# Patient Record
Sex: Female | Born: 1945 | Race: White | Hispanic: No | Marital: Married | Smoking: Never smoker
Health system: Southern US, Community
[De-identification: ages and names within clinical notes are randomized; demographics above are authoritative.]

## PROBLEM LIST (undated history)

## (undated) DIAGNOSIS — F419 Anxiety disorder, unspecified: Secondary | ICD-10-CM

## (undated) DIAGNOSIS — E7800 Pure hypercholesterolemia, unspecified: Secondary | ICD-10-CM

## (undated) DIAGNOSIS — G459 Transient cerebral ischemic attack, unspecified: Secondary | ICD-10-CM

## (undated) DIAGNOSIS — E010 Iodine-deficiency related diffuse (endemic) goiter: Secondary | ICD-10-CM

## (undated) DIAGNOSIS — F32A Depression, unspecified: Secondary | ICD-10-CM

## (undated) DIAGNOSIS — E785 Hyperlipidemia, unspecified: Secondary | ICD-10-CM

## (undated) DIAGNOSIS — E119 Type 2 diabetes mellitus without complications: Secondary | ICD-10-CM

## (undated) DIAGNOSIS — R42 Dizziness and giddiness: Secondary | ICD-10-CM

## (undated) DIAGNOSIS — F5101 Primary insomnia: Secondary | ICD-10-CM

## (undated) DIAGNOSIS — I10 Essential (primary) hypertension: Secondary | ICD-10-CM

## (undated) DIAGNOSIS — F329 Major depressive disorder, single episode, unspecified: Secondary | ICD-10-CM

## (undated) DIAGNOSIS — E538 Deficiency of other specified B group vitamins: Secondary | ICD-10-CM

## (undated) DIAGNOSIS — I6523 Occlusion and stenosis of bilateral carotid arteries: Secondary | ICD-10-CM

## (undated) HISTORY — DX: Occlusion and stenosis of bilateral carotid arteries: I65.23

## (undated) HISTORY — DX: Iodine-deficiency related diffuse (endemic) goiter: E01.0

## (undated) HISTORY — DX: Transient cerebral ischemic attack, unspecified: G45.9

## (undated) HISTORY — DX: Essential (primary) hypertension: I10

## (undated) HISTORY — PX: TUBAL LIGATION: SHX77

## (undated) HISTORY — DX: Deficiency of other specified B group vitamins: E53.8

## (undated) HISTORY — DX: Primary insomnia: F51.01

## (undated) HISTORY — DX: Anxiety disorder, unspecified: F41.9

---

## 1999-01-17 ENCOUNTER — Other Ambulatory Visit: Admission: RE | Admit: 1999-01-17 | Discharge: 1999-01-17 | Payer: Self-pay | Admitting: Gynecology

## 1999-01-25 ENCOUNTER — Other Ambulatory Visit: Admission: RE | Admit: 1999-01-25 | Discharge: 1999-01-25 | Payer: Self-pay | Admitting: Gynecology

## 1999-01-25 ENCOUNTER — Encounter (INDEPENDENT_AMBULATORY_CARE_PROVIDER_SITE_OTHER): Payer: Self-pay | Admitting: *Deleted

## 2000-04-06 ENCOUNTER — Other Ambulatory Visit: Admission: RE | Admit: 2000-04-06 | Discharge: 2000-04-06 | Payer: Self-pay | Admitting: Gynecology

## 2001-05-06 ENCOUNTER — Other Ambulatory Visit: Admission: RE | Admit: 2001-05-06 | Discharge: 2001-05-06 | Payer: Self-pay | Admitting: Gynecology

## 2002-03-30 ENCOUNTER — Encounter: Payer: Self-pay | Admitting: Internal Medicine

## 2002-03-30 ENCOUNTER — Encounter (INDEPENDENT_AMBULATORY_CARE_PROVIDER_SITE_OTHER): Payer: Self-pay | Admitting: *Deleted

## 2002-03-30 ENCOUNTER — Encounter: Admission: RE | Admit: 2002-03-30 | Discharge: 2002-03-30 | Payer: Self-pay | Admitting: Internal Medicine

## 2002-11-08 ENCOUNTER — Other Ambulatory Visit: Admission: RE | Admit: 2002-11-08 | Discharge: 2002-11-08 | Payer: Self-pay | Admitting: Gynecology

## 2004-09-23 ENCOUNTER — Other Ambulatory Visit: Admission: RE | Admit: 2004-09-23 | Discharge: 2004-09-23 | Payer: Self-pay | Admitting: Gynecology

## 2007-10-07 ENCOUNTER — Ambulatory Visit (HOSPITAL_BASED_OUTPATIENT_CLINIC_OR_DEPARTMENT_OTHER): Admission: RE | Admit: 2007-10-07 | Discharge: 2007-10-07 | Payer: Self-pay | Admitting: Orthopedic Surgery

## 2010-06-11 NOTE — Op Note (Signed)
Stacy Orozco, Stacy Orozco NO.:  [id number]   MEDICAL RECORD NO.:  [id number]          PATIENT TYPE:  AMB   LOCATION:  DSC                          FACILITY:  [facility]   PHYSICIAN:  Cindee Salt, M.D.       DATE OF BIRTH:  Mar 12, 1945   DATE OF PROCEDURE:  10/07/2007  DATE OF DISCHARGE:                               OPERATIVE REPORT   PREOPERATIVE DIAGNOSIS:  Carpal tunnel syndrome, right hand.   POSTOPERATIVE DIAGNOSIS:  Carpal tunnel syndrome, right hand.   OPERATION:  Decompression, right median nerve.   SURGEON:  Cindee Salt, MD   ASSISTANT:  Joaquin Courts, RN   ANESTHESIA:  Forearm based IV regional.   ANESTHESIOLOGIST:  Guadalupe Maple, MD   HISTORY:  The patient is a 65 year old female with a history of carpal  tunnel syndrome, EMG nerve conductions positive which has not responded  to conservative treatment.  She has elected to proceed to have this  surgically released.  Postoperative course has been discussed along with  risks and complications.  She is aware there is no guarantee with  surgery, possible infection, recurrence, injury to arteries, nerves,  tendons, complete relief of symptoms, and dystrophy.  In the  preoperative area, the patient was seen.  The extremity marked by both  the patient and surgeon.  Antibiotic given.   PROCEDURE:  The patient was brought to the operating room where a  forearm based IV regional anesthetic was carried out without difficulty.  She was prepped using DuraPrep, supine position, right arm free.  A time-  out was taken.  Following this, a longitudinal incision was made in the  palm and carried down through subcutaneous tissue.  Bleeders were  electrocauterized.  The palmar fascia was split.  Superficial palmar  arch identified.  The flexor tendon to the ring and little finger  identified.  To the ulnar side of the median nerve, the carpal  retinaculum was incised with sharp dissection.  A right-angle and Sewall  retractor were placed between skin and forearm fascia.  The fascia was  released for approximately a centimeter and half proximal to the wrist  crease under direct vision.  Canal was explored.  Air compression of the  nerve was apparent.  No further lesions were identified.  The wound was  irrigated.  The skin closed interrupted with interrupted 5-0 Vicryl  Rapide sutures.  Sterile compressive dressings and splint to the wrist  applied.  The patient tolerated the procedure well and was taken to the  recovery room observation in satisfactory condition.  She will be  discharged home to return to the [facility] in 1 week on  Vicodin.          ______________________________  Cindee Salt, M.D.    GK/MEDQ  D:  10/07/2007  T:  10/08/2007  Job:  [id number]

## 2010-10-30 LAB — POCT HEMOGLOBIN-HEMACUE: Hemoglobin: 12.5

## 2010-11-07 ENCOUNTER — Ambulatory Visit (HOSPITAL_COMMUNITY)
Admission: RE | Admit: 2010-11-07 | Discharge: 2010-11-07 | Disposition: A | Discharge status: Home / Self Care | Payer: Medicare Other | Source: Ambulatory Visit | Attending: Gastroenterology | Admitting: Gastroenterology

## 2010-11-07 DIAGNOSIS — Z79899 Other long term (current) drug therapy: Secondary | ICD-10-CM | POA: Insufficient documentation

## 2010-11-07 DIAGNOSIS — E7800 Pure hypercholesterolemia, unspecified: Secondary | ICD-10-CM | POA: Insufficient documentation

## 2010-11-07 DIAGNOSIS — G4700 Insomnia, unspecified: Secondary | ICD-10-CM | POA: Insufficient documentation

## 2010-11-07 DIAGNOSIS — Z860100 Personal history of colon polyps, unspecified: Secondary | ICD-10-CM | POA: Insufficient documentation

## 2010-11-07 DIAGNOSIS — Z1211 Encounter for screening for malignant neoplasm of colon: Secondary | ICD-10-CM | POA: Insufficient documentation

## 2010-11-07 DIAGNOSIS — Z7982 Long term (current) use of aspirin: Secondary | ICD-10-CM | POA: Insufficient documentation

## 2010-11-18 NOTE — Op Note (Signed)
  NAMEMarland Kitchen  EMBERLI, BALLESTER NO.:  [id number]  MEDICAL RECORD NO.:  [id number]  LOCATION:  WLEN                         FACILITY:  [facility]  PHYSICIAN:  Danise Edge, M.D.   DATE OF BIRTH:  1945/03/30  DATE OF PROCEDURE:  11/07/2010 DATE OF DISCHARGE:                              OPERATIVE REPORT   REFERRING PHYSICIAN:  Theressa Millard, MD  PROCEDURE:  Surveillance colonoscopy.  HISTORY:  Ms. Stacy Orozco is a 65 year old female born on 12-10-1945.  In 2009, she underwent a baseline screening colonoscopy with removal of a small adenomatous polyp from the sigmoid colon.  She is scheduled for a surveillance colonoscopy today.  ENDOSCOPIST:  Danise Edge, MD  PREMEDICATION:  Versed 7.5 mg, fentanyl 100 mcg.  PROCEDURE IN DETAIL:  The patient was placed in the left lateral decubitus position.  Anal inspection and digital rectal exam were normal.  The Pentax pediatric colonoscope was introduced into the rectum and advanced to the cecum.  A normal-appearing ileocecal valve and appendiceal orifice were identified.  Colonic preparation for the exam today was good.  Rectum normal.  Retroflex view of the distal rectum normal.  Sigmoid colon and descending colon normal.  Splenic flexure normal.  Transverse colon normal.  Hepatic flexure normal.  Ascending colon normal.  Cecum and ileocecal valve normal.  ASSESSMENT:  Normal surveillance proctocolonoscopy to the cecum.  RECOMMENDATIONS:  Schedule repeat surveillance colonoscopy in 5 years.          ______________________________ Danise Edge, M.D.     MJ/MEDQ  D:  11/07/2010  T:  11/07/2010  Job:  [id number]  cc:   Theressa Millard, M.D. Fax: [phone]  Electronically Signed by Danise Edge M.D. on 11/18/2010 02:05:30 PM

## 2012-12-04 ENCOUNTER — Emergency Department (HOSPITAL_COMMUNITY): Payer: Medicare Other

## 2012-12-04 ENCOUNTER — Encounter (HOSPITAL_COMMUNITY): Payer: Self-pay | Admitting: Emergency Medicine

## 2012-12-04 ENCOUNTER — Emergency Department (HOSPITAL_COMMUNITY)
Admission: EM | Admit: 2012-12-04 | Discharge: 2012-12-04 | Disposition: A | Discharge status: Home / Self Care | Payer: Medicare Other | Attending: Emergency Medicine | Admitting: Emergency Medicine

## 2012-12-04 DIAGNOSIS — Z79899 Other long term (current) drug therapy: Secondary | ICD-10-CM | POA: Insufficient documentation

## 2012-12-04 DIAGNOSIS — F3289 Other specified depressive episodes: Secondary | ICD-10-CM | POA: Insufficient documentation

## 2012-12-04 DIAGNOSIS — E119 Type 2 diabetes mellitus without complications: Secondary | ICD-10-CM | POA: Insufficient documentation

## 2012-12-04 DIAGNOSIS — R0789 Other chest pain: Secondary | ICD-10-CM | POA: Insufficient documentation

## 2012-12-04 DIAGNOSIS — F411 Generalized anxiety disorder: Secondary | ICD-10-CM | POA: Insufficient documentation

## 2012-12-04 DIAGNOSIS — F419 Anxiety disorder, unspecified: Secondary | ICD-10-CM

## 2012-12-04 DIAGNOSIS — R42 Dizziness and giddiness: Secondary | ICD-10-CM | POA: Insufficient documentation

## 2012-12-04 DIAGNOSIS — R002 Palpitations: Secondary | ICD-10-CM | POA: Insufficient documentation

## 2012-12-04 DIAGNOSIS — Z880 Allergy status to penicillin: Secondary | ICD-10-CM | POA: Insufficient documentation

## 2012-12-04 DIAGNOSIS — E785 Hyperlipidemia, unspecified: Secondary | ICD-10-CM | POA: Insufficient documentation

## 2012-12-04 DIAGNOSIS — F329 Major depressive disorder, single episode, unspecified: Secondary | ICD-10-CM | POA: Insufficient documentation

## 2012-12-04 DIAGNOSIS — E7800 Pure hypercholesterolemia, unspecified: Secondary | ICD-10-CM | POA: Insufficient documentation

## 2012-12-04 DIAGNOSIS — M542 Cervicalgia: Secondary | ICD-10-CM | POA: Insufficient documentation

## 2012-12-04 DIAGNOSIS — K5900 Constipation, unspecified: Secondary | ICD-10-CM | POA: Insufficient documentation

## 2012-12-04 HISTORY — DX: Type 2 diabetes mellitus without complications: E11.9

## 2012-12-04 HISTORY — DX: Pure hypercholesterolemia, unspecified: E78.00

## 2012-12-04 LAB — COMPREHENSIVE METABOLIC PANEL
ALT: 17 U/L (ref 0–35)
AST: 16 U/L (ref 0–37)
Alkaline Phosphatase: 70 U/L (ref 39–117)
BUN: 15 mg/dL (ref 6–23)
CO2: 24 mEq/L (ref 19–32)
Calcium: 10 mg/dL (ref 8.4–10.5)
GFR calc Af Amer: >90 mL/min (ref >90)
GFR calc non Af Amer: 89 mL/min — ABNORMAL LOW (ref >90)
Glucose, Bld: 146 mg/dL — ABNORMAL HIGH (ref 70–99)
Potassium: 4.3 mEq/L (ref 3.5–5.1)
Sodium: 137 mEq/L (ref 135–145)
Total Bilirubin: 0.5 mg/dL (ref 0.3–1.2)

## 2012-12-04 LAB — CBC
HCT: 38 % (ref 36.0–46.0)
Hemoglobin: 13.6 g/dL (ref 12.0–15.0)
MCH: 31.9 pg (ref 26.0–34.0)
MCHC: 35.8 g/dL (ref 30.0–36.0)
RBC: 4.27 MIL/uL (ref 3.87–5.11)
WBC: 5 10*3/uL (ref 4.0–10.5)

## 2012-12-04 LAB — POCT I-STAT TROPONIN I

## 2012-12-04 MED ORDER — LORAZEPAM 1 MG PO TABS
0.5000 mg | ORAL_TABLET | Freq: Once | ORAL | Status: AC
  Administered 2012-12-04: 0.5 mg via ORAL
  Filled 2012-12-04: qty 1

## 2012-12-04 NOTE — ED Notes (Signed)
Pt states sternal chest pain that radiates to back starting on Thursday.  Yesterday pain radiated to L arm and last night she was unable to sleep d/t the pain.  Denies nausea, sob but c/o dizziness.

## 2012-12-04 NOTE — ED Provider Notes (Signed)
I saw and evaluated the patient, reviewed the resident's note and I agree with the findings and plan.  EKG Interpretation     Ventricular Rate:  67 PR Interval:  160 QRS Duration: 98 QT Interval:  408 QTC Calculation: 431 R Axis:   48 Text Interpretation:  Normal sinus rhythm with sinus arrhythmia Normal ECG           patient here with constant left-sided chest neck and arm pain x24 hours. Similar symptoms associated with increased stress anxiety and patient admits that she has but currently. Denies any concurrent symptoms of diaphoresis or dyspnea with her symptoms. No syncope or near-syncope. No recent fever or chills. On physical exam chest is nontender. Her EKG does not show any acute ST changes. Troponin was negative. No concern for ACS. Will give patient Ativan here and recheck and likely discharged home.   Toy Baker, MD 12/04/12 [phone]

## 2012-12-04 NOTE — ED Notes (Signed)
Patient transported to X-ray 

## 2012-12-04 NOTE — ED Provider Notes (Signed)
CSN: [id number]     Arrival date & time 12/04/12  1017 History   First MD Initiated Contact with Patient 12/04/12 1034     Chief Complaint  Patient presents with  . Chest Pain   (Consider location/radiation/quality/duration/timing/severity/associated sxs/prior Treatment) Patient is a 67 y.o. female presenting with chest pain.  Chest Pain Associated symptoms: dizziness and palpitations   Associated symptoms: no abdominal pain, no back pain, no cough, no diaphoresis, no fatigue, no fever, no headache, no nausea, no numbness, no shortness of breath, not vomiting and no weakness     Pt is a 67 yo female with a PMH of controlled DM2 (diet controlled last HA1c: 7.3), dyslipidemia, and depression/anxiety.  She presents to the ED with cervical pain and substernal CP that woke her up from sleep yesterday morning around 4am.  She states she first felt a tightness in her neck and then she felt CP 6/10 that she describes as dull and radiating into her left arm.  She states it got better throughout the day but was continuous.  She took aleve but it did not help, however, she took some Tums and felt that did help.  She states the pain is worse when she lies down and better when she sits up.  She denies any associated N/V, SOB, weakness, presyncope, diaphoresis, or back pain.  She endorses some dizziness upon standing.  She reports having a similar pain when she gets "stressed out" and has been experiencing a lot of stressors in her life recently with sick family members.  She denies any strenuous exercises or activity.  She denies cigarette smoking or any other recreational drug use.  FH is significant for her father who had an MI in his late 39's.   Past Medical History  Diagnosis Date  . Diabetes mellitus without complication     diet controlled  . Hypercholesteremia    Past Surgical History  Procedure Laterality Date  . Tubal ligation     No family history on file. History  Substance Use Topics  .  Smoking status: Never Smoker   . Smokeless tobacco: Not on file  . Alcohol Use: No   OB History   Grav Para Term Preterm Abortions TAB SAB Ect Mult Living                 Review of Systems  Constitutional: Negative for fever, chills, diaphoresis and fatigue.  Respiratory: Negative for cough, chest tightness, shortness of breath and wheezing.   Cardiovascular: Positive for chest pain and palpitations. Negative for leg swelling.  Gastrointestinal: Positive for constipation. Negative for nausea, vomiting, abdominal pain, diarrhea and blood in stool.  Genitourinary: Negative for dysuria and frequency.  Musculoskeletal: Positive for neck pain and neck stiffness. Negative for back pain.  Skin: Negative for rash and wound.  Neurological: Positive for dizziness and light-headedness. Negative for syncope, weakness, numbness and headaches.  Psychiatric/Behavioral: The patient is nervous/anxious.      Allergies  Penicillins  Home Medications   Current Outpatient Rx  Name  Route  Sig  Dispense  Refill  . atorvastatin (LIPITOR) 20 MG tablet   Oral   Take 20 mg by mouth daily.         . calcium carbonate (OS-CAL) 600 MG TABS tablet   Oral   Take 600 mg by mouth 2 (two) times daily with a meal.         . chloridazePOXIDE-amitriptyline (LIMBITROL) 5-12.5 MG per tablet   Oral   Take  1 tablet by mouth at bedtime.         Marland Kitchen CINNAMON PO   Oral   Take 1,000 mg by mouth 2 (two) times daily.         . citalopram (CELEXA) 20 MG tablet   Oral   Take 20 mg by mouth daily.         . Lutein 6 MG TABS   Oral   Take 6 mg by mouth daily.         . vitamin E 400 UNIT capsule   Oral   Take 400 Units by mouth daily.          BP 151/56  Pulse 57  Temp(Src) 98.5 F (36.9 C) (Oral)  Resp 16  Ht 5\' 6"  (1.676 m)  Wt 150 lb 9.6 oz (68.312 kg)  BMI 24.32 kg/m2  SpO2 97% Physical Exam  Nursing note and vitals reviewed. Constitutional: She is oriented to person, place, and  time. She appears well-developed and well-nourished. No distress.  HENT:  Head: Normocephalic and atraumatic.  Eyes: Conjunctivae and EOM are normal. Pupils are equal, round, and reactive to light.  Neck: Normal range of motion. Neck supple.  Cardiovascular: Normal rate, regular rhythm, normal heart sounds and intact distal pulses.  Exam reveals no gallop and no friction rub.   No murmur heard. Pulmonary/Chest: Effort normal and breath sounds normal. No respiratory distress. She has no wheezes. She has no rales. She exhibits no tenderness.  Abdominal: Soft. Bowel sounds are normal. She exhibits no distension. There is no tenderness.  Neurological: She is alert and oriented to person, place, and time. No cranial nerve deficit.  Skin: Skin is warm and dry. No rash noted. She is not diaphoretic.  Psychiatric: She has a normal mood and affect.    ED Course  Procedures (including critical care time) Labs Review Labs Reviewed  COMPREHENSIVE METABOLIC PANEL - Abnormal; Notable for the following:    Glucose, Bld 146 (*)    GFR calc non Af Amer 89 (*)    All other components within normal limits  CBC  TROPONIN I  POCT I-STAT TROPONIN I   Imaging Review Dg Chest 2 View  12/04/2012   CLINICAL DATA:  Mid chest pain x2 days  EXAM: CHEST  2 VIEW  COMPARISON:  None.  FINDINGS: Lungs are clear. No pleural effusion or pneumothorax.  The heart is normal in size.  Visualized osseous structures are within normal limits.  IMPRESSION: No evidence of acute cardiopulmonary disease.   Electronically Signed   By: Charline Bills M.D.   On: 12/04/2012 11:39    EKG Interpretation     Ventricular Rate:  67 PR Interval:  160 QRS Duration: 98 QT Interval:  408 QTC Calculation: 431 R Axis:   48 Text Interpretation:  Normal sinus rhythm with sinus arrhythmia Normal ECG            MDM   1. Atypical chest pain   2. Anxiety   3. Diabetes mellitus type 2, diet-controlled   4. Dyslipidemia     Atypical Chest Pain: Pt presents with h/o continuous substernal CP.  DDx considered were ACS (TIMI score: 2), musculoskeletal, aortic dissection (normal CXR), pneumothorax (normal CXR), anxiety, GERD.  Pt had a normal EKG and normal I-stat troponin and troponin I.  Pt CP has resolved in the ED with ativan and she is feeling better and will be discharged.  Most likely anxiety related to recent stressors-pt is advised to  follow-up with her PCP.     Boykin Peek, MD 12/04/12 1340

## 2012-12-04 NOTE — ED Notes (Signed)
Pt returned from xray

## 2012-12-05 NOTE — ED Provider Notes (Signed)
I saw and evaluated the patient, reviewed the resident's note and I agree with the findings and plan.  EKG Interpretation     Ventricular Rate:  67 PR Interval:  160 QRS Duration: 98 QT Interval:  408 QTC Calculation: 431 R Axis:   48 Text Interpretation:  Normal sinus rhythm with sinus arrhythmia Normal ECG             Toy Baker, MD 12/05/12 1442

## 2013-11-30 ENCOUNTER — Other Ambulatory Visit: Payer: Self-pay | Admitting: Internal Medicine

## 2013-11-30 DIAGNOSIS — R221 Localized swelling, mass and lump, neck: Secondary | ICD-10-CM

## 2013-12-05 ENCOUNTER — Ambulatory Visit
Admission: RE | Admit: 2013-12-05 | Discharge: 2013-12-05 | Disposition: A | Discharge status: Home / Self Care | Payer: Medicare Other | Source: Ambulatory Visit | Attending: Internal Medicine | Admitting: Internal Medicine

## 2013-12-05 DIAGNOSIS — R221 Localized swelling, mass and lump, neck: Secondary | ICD-10-CM

## 2015-05-15 ENCOUNTER — Emergency Department (HOSPITAL_COMMUNITY): Admission: EM | Admit: 2015-05-15 | Discharge: 2015-05-15 | Discharge status: Absent without leave | Payer: Self-pay

## 2015-05-15 DIAGNOSIS — Z79899 Other long term (current) drug therapy: Secondary | ICD-10-CM | POA: Diagnosis not present

## 2015-05-15 DIAGNOSIS — Z7982 Long term (current) use of aspirin: Secondary | ICD-10-CM | POA: Insufficient documentation

## 2015-05-15 DIAGNOSIS — E7800 Pure hypercholesterolemia, unspecified: Secondary | ICD-10-CM | POA: Insufficient documentation

## 2015-05-15 DIAGNOSIS — R51 Headache: Secondary | ICD-10-CM | POA: Diagnosis present

## 2015-05-15 DIAGNOSIS — Z7984 Long term (current) use of oral hypoglycemic drugs: Secondary | ICD-10-CM | POA: Insufficient documentation

## 2015-05-15 DIAGNOSIS — Z880 Allergy status to penicillin: Secondary | ICD-10-CM | POA: Insufficient documentation

## 2015-05-15 DIAGNOSIS — F439 Reaction to severe stress, unspecified: Secondary | ICD-10-CM | POA: Diagnosis not present

## 2015-05-15 DIAGNOSIS — E119 Type 2 diabetes mellitus without complications: Secondary | ICD-10-CM | POA: Insufficient documentation

## 2015-05-15 NOTE — ED Notes (Signed)
Pt stated that she was going to go to Stacy Orozco to be seen

## 2015-05-16 ENCOUNTER — Encounter (HOSPITAL_COMMUNITY): Payer: Self-pay | Admitting: *Deleted

## 2015-05-16 ENCOUNTER — Emergency Department (HOSPITAL_COMMUNITY)
Admission: EM | Admit: 2015-05-16 | Discharge: 2015-05-16 | Disposition: A | Discharge status: Home / Self Care | Payer: Medicare Other | Attending: Emergency Medicine | Admitting: Emergency Medicine

## 2015-05-16 DIAGNOSIS — R519 Headache, unspecified: Secondary | ICD-10-CM

## 2015-05-16 DIAGNOSIS — F430 Acute stress reaction: Secondary | ICD-10-CM

## 2015-05-16 DIAGNOSIS — R51 Headache: Secondary | ICD-10-CM

## 2015-05-16 MED ORDER — KETOROLAC TROMETHAMINE 15 MG/ML IJ SOLN
15.0000 mg | Freq: Once | INTRAMUSCULAR | Status: AC
  Administered 2015-05-16: 15 mg via INTRAVENOUS
  Filled 2015-05-16: qty 1

## 2015-05-16 MED ORDER — DEXAMETHASONE SODIUM PHOSPHATE 10 MG/ML IJ SOLN
10.0000 mg | Freq: Once | INTRAMUSCULAR | Status: DC
  Filled 2015-05-16: qty 1

## 2015-05-16 MED ORDER — DIPHENHYDRAMINE HCL 25 MG PO CAPS
25.0000 mg | ORAL_CAPSULE | Freq: Once | ORAL | Status: AC
  Administered 2015-05-16: 25 mg via ORAL
  Filled 2015-05-16: qty 1

## 2015-05-16 MED ORDER — LORAZEPAM 2 MG/ML IJ SOLN
0.5000 mg | Freq: Once | INTRAMUSCULAR | Status: AC
  Administered 2015-05-16: 0.5 mg via INTRAVENOUS
  Filled 2015-05-16: qty 1

## 2015-05-16 MED ORDER — METOCLOPRAMIDE HCL 5 MG/ML IJ SOLN
10.0000 mg | Freq: Once | INTRAMUSCULAR | Status: AC
  Administered 2015-05-16: 10 mg via INTRAVENOUS

## 2015-05-16 MED ORDER — METOCLOPRAMIDE HCL 5 MG/ML IJ SOLN
10.0000 mg | Freq: Once | INTRAMUSCULAR | Status: DC
  Filled 2015-05-16: qty 2

## 2015-05-16 MED ORDER — DEXAMETHASONE SODIUM PHOSPHATE 10 MG/ML IJ SOLN
10.0000 mg | Freq: Once | INTRAMUSCULAR | Status: AC
  Administered 2015-05-16: 10 mg via INTRAVENOUS

## 2015-05-16 NOTE — ED Provider Notes (Signed)
CSN: [id number]     Arrival date & time 05/15/15  2354 History   First MD Initiated Contact with Patient 05/16/15 0421     Chief Complaint  Patient presents with  . Headache     (Consider location/radiation/quality/duration/timing/severity/associated sxs/prior Treatment) HPI Comments: Patient presents with episodic headache x 1 week. She has some nausea without vomiting. No photophobia. No fever. She reports the headache is worse at night and less intense during the day. She is here with her daughter who states she has been under significant stress lately with multiple family member deaths in a short period and a husband debilitated by stroke. She has had similar headaches in the past, also related to a period of high stress. She was concerned regarding medication changes she has had recently and whether they are contributing to symptoms, specifically, she was started on Lexapro, then stopped immediately secondary to adverse reaction; started her citalopram she was taking last year; was switched from citalopram to wellbutrin and then stopped secondary to adverse reactions. She has had a change of her amitriptyline from 5 mg to 25 mg and started Cipro for UTI yesterday.   Patient is a 70 y.o. female presenting with headaches. The history is provided by the patient and a relative. No language interpreter was used.  Headache Pain location:  Occipital Quality:  Sharp Associated symptoms: nausea   Associated symptoms: no fever, no myalgias and no vomiting     Past Medical History  Diagnosis Date  . Diabetes mellitus without complication (HCC)     diet controlled  . Hypercholesteremia    Past Surgical History  Procedure Laterality Date  . Tubal ligation     History reviewed. No pertinent family history. Social History  Substance Use Topics  . Smoking status: Never Smoker   . Smokeless tobacco: None  . Alcohol Use: No   OB History    No data available     Review of Systems   Constitutional: Negative for fever and chills.  Respiratory: Negative.  Negative for shortness of breath.   Cardiovascular: Negative.  Negative for chest pain.  Gastrointestinal: Positive for nausea. Negative for vomiting.  Genitourinary: Positive for dysuria (being treated for UTI currently).  Musculoskeletal: Negative.  Negative for myalgias.  Skin: Negative.   Neurological: Positive for headaches.      Allergies  Penicillins  Home Medications   Prior to Admission medications   Medication Sig Start Date End Date Taking? Authorizing Provider  amitriptyline (ELAVIL) 25 MG tablet Take 25 mg by mouth at bedtime. 05/15/15  Yes Historical Provider, MD  aspirin EC 81 MG tablet Take 81 mg by mouth daily.   Yes Historical Provider, MD  atorvastatin (LIPITOR) 20 MG tablet Take 20 mg by mouth daily.   Yes Historical Provider, MD  calcium carbonate (OS-CAL) 600 MG TABS tablet Take 600 mg by mouth 2 (two) times daily with a meal.   Yes Historical Provider, MD  chlordiazePOXIDE (LIBRIUM) 5 MG capsule Take 5 mg by mouth at bedtime.   Yes Historical Provider, MD  ciprofloxacin (CIPRO) 500 MG tablet Take 500 mg by mouth 2 (two) times daily. 05/15/15  Yes Historical Provider, MD  HYDROcodone-acetaminophen (NORCO/VICODIN) 5-325 MG tablet Take 1 tablet by mouth every 6 (six) hours as needed for moderate pain or severe pain.  05/11/15  Yes Historical Provider, MD  metFORMIN (GLUCOPHAGE-XR) 500 MG 24 hr tablet Take 500 mg by mouth 2 (two) times daily. 04/24/15  Yes Historical Provider, MD  vitamin E  400 UNIT capsule Take 400 Units by mouth daily.   Yes Historical Provider, MD   BP 184/91 mmHg  Pulse 72  Temp(Src) 98.1 F (36.7 C) (Oral)  Resp 14  Ht 5\' 6"  (1.676 m)  Wt 67.586 kg  BMI 24.06 kg/m2  SpO2 100% Physical Exam  Constitutional: She is oriented to person, place, and time. She appears well-developed and well-nourished.  HENT:  Head: Normocephalic.  Eyes: Pupils are equal, round, and  reactive to light.  Neck: Normal range of motion. Neck supple.  Cardiovascular: Normal rate and regular rhythm.   Pulmonary/Chest: Effort normal and breath sounds normal.  Abdominal: Soft. Bowel sounds are normal. There is no tenderness. There is no rebound and no guarding.  Musculoskeletal: Normal range of motion.  Neurological: She is alert and oriented to person, place, and time. She has normal strength and normal reflexes. No sensory deficit. She displays a negative Romberg sign. Coordination normal.  CN's 3-12 grossly intact. Speech clear and focused. No deficits of coordination. Ambulatory without ataxia.  Skin: Skin is warm and dry. No rash noted.  Psychiatric: She has a normal mood and affect.    ED Course  Procedures (including critical care time) Labs Review Labs Reviewed - No data to display  Imaging Review No results found. I have personally reviewed and evaluated these images and lab results as part of my medical decision-making.   EKG Interpretation None      MDM   Final diagnoses:  None    1. Headache 2. Stress reaction  The patient is neurologically intact without focal neurologic deficit. Headache is similar to past headaches and started more than 1 week ago. VSS. Pain is significantly better with medications here. She is examined by Dr. Read DriversMolpus and is felt appropriate for discharge home with PCP follow up later today.   Elpidio AnisShari Caia Lofaro, PA-C 05/16/15 [id number]  Paula LibraJohn Molpus, MD 05/16/15 [phone]

## 2015-05-16 NOTE — ED Notes (Signed)
Patient is alert and oriented x4.  She is complaining of a headache that has been on going for over the Last week.  Patient states that she has been under a lot of stress due to multiple family members recently  Dying.  Currently she rates her pain 10 of 10.

## 2015-05-16 NOTE — Discharge Instructions (Signed)

## 2015-05-20 ENCOUNTER — Encounter (HOSPITAL_COMMUNITY): Payer: Self-pay

## 2015-05-20 ENCOUNTER — Emergency Department (HOSPITAL_COMMUNITY)
Admission: EM | Admit: 2015-05-20 | Discharge: 2015-05-21 | Disposition: A | Discharge status: Home / Self Care | Payer: Medicare Other | Attending: Emergency Medicine | Admitting: Emergency Medicine

## 2015-05-20 DIAGNOSIS — Z792 Long term (current) use of antibiotics: Secondary | ICD-10-CM | POA: Diagnosis not present

## 2015-05-20 DIAGNOSIS — Z79899 Other long term (current) drug therapy: Secondary | ICD-10-CM | POA: Diagnosis not present

## 2015-05-20 DIAGNOSIS — R51 Headache: Secondary | ICD-10-CM

## 2015-05-20 DIAGNOSIS — Z7984 Long term (current) use of oral hypoglycemic drugs: Secondary | ICD-10-CM | POA: Insufficient documentation

## 2015-05-20 DIAGNOSIS — Z880 Allergy status to penicillin: Secondary | ICD-10-CM | POA: Diagnosis not present

## 2015-05-20 DIAGNOSIS — E119 Type 2 diabetes mellitus without complications: Secondary | ICD-10-CM | POA: Insufficient documentation

## 2015-05-20 DIAGNOSIS — R519 Headache, unspecified: Secondary | ICD-10-CM

## 2015-05-20 DIAGNOSIS — E7800 Pure hypercholesterolemia, unspecified: Secondary | ICD-10-CM | POA: Diagnosis not present

## 2015-05-20 DIAGNOSIS — R079 Chest pain, unspecified: Secondary | ICD-10-CM

## 2015-05-20 DIAGNOSIS — Z7982 Long term (current) use of aspirin: Secondary | ICD-10-CM | POA: Insufficient documentation

## 2015-05-20 DIAGNOSIS — E785 Hyperlipidemia, unspecified: Secondary | ICD-10-CM | POA: Insufficient documentation

## 2015-05-20 DIAGNOSIS — F329 Major depressive disorder, single episode, unspecified: Secondary | ICD-10-CM | POA: Diagnosis not present

## 2015-05-20 DIAGNOSIS — F32A Depression, unspecified: Secondary | ICD-10-CM

## 2015-05-20 HISTORY — DX: Major depressive disorder, single episode, unspecified: F32.9

## 2015-05-20 HISTORY — DX: Hyperlipidemia, unspecified: E78.5

## 2015-05-20 HISTORY — DX: Depression, unspecified: F32.A

## 2015-05-21 ENCOUNTER — Emergency Department (HOSPITAL_COMMUNITY): Payer: Medicare Other

## 2015-05-21 ENCOUNTER — Emergency Department (HOSPITAL_COMMUNITY)
Admission: EM | Admit: 2015-05-21 | Discharge: 2015-05-22 | Disposition: A | Discharge status: Home / Self Care | Payer: Medicare Other | Attending: Emergency Medicine | Admitting: Emergency Medicine

## 2015-05-21 ENCOUNTER — Encounter (HOSPITAL_COMMUNITY): Payer: Self-pay | Admitting: Emergency Medicine

## 2015-05-21 DIAGNOSIS — E119 Type 2 diabetes mellitus without complications: Secondary | ICD-10-CM | POA: Diagnosis not present

## 2015-05-21 DIAGNOSIS — E785 Hyperlipidemia, unspecified: Secondary | ICD-10-CM | POA: Diagnosis not present

## 2015-05-21 DIAGNOSIS — Z880 Allergy status to penicillin: Secondary | ICD-10-CM | POA: Diagnosis not present

## 2015-05-21 DIAGNOSIS — F419 Anxiety disorder, unspecified: Secondary | ICD-10-CM

## 2015-05-21 DIAGNOSIS — Z792 Long term (current) use of antibiotics: Secondary | ICD-10-CM | POA: Insufficient documentation

## 2015-05-21 DIAGNOSIS — R079 Chest pain, unspecified: Secondary | ICD-10-CM | POA: Insufficient documentation

## 2015-05-21 DIAGNOSIS — R300 Dysuria: Secondary | ICD-10-CM | POA: Diagnosis not present

## 2015-05-21 DIAGNOSIS — R002 Palpitations: Secondary | ICD-10-CM

## 2015-05-21 DIAGNOSIS — Z7982 Long term (current) use of aspirin: Secondary | ICD-10-CM | POA: Diagnosis not present

## 2015-05-21 DIAGNOSIS — R51 Headache: Secondary | ICD-10-CM | POA: Insufficient documentation

## 2015-05-21 DIAGNOSIS — Z7984 Long term (current) use of oral hypoglycemic drugs: Secondary | ICD-10-CM | POA: Insufficient documentation

## 2015-05-21 DIAGNOSIS — E7800 Pure hypercholesterolemia, unspecified: Secondary | ICD-10-CM | POA: Diagnosis not present

## 2015-05-21 DIAGNOSIS — Z79899 Other long term (current) drug therapy: Secondary | ICD-10-CM | POA: Diagnosis not present

## 2015-05-21 DIAGNOSIS — F329 Major depressive disorder, single episode, unspecified: Secondary | ICD-10-CM | POA: Diagnosis present

## 2015-05-21 DIAGNOSIS — F4329 Adjustment disorder with other symptoms: Secondary | ICD-10-CM

## 2015-05-21 LAB — I-STAT TROPONIN, ED
TROPONIN I, POC: 0 ng/mL (ref 0.00–0.08)
Troponin i, poc: 0 ng/mL (ref 0.00–0.08)
Troponin i, poc: 0 ng/mL (ref 0.00–0.08)

## 2015-05-21 LAB — BASIC METABOLIC PANEL
ANION GAP: 8 (ref 5–15)
Anion gap: 10 (ref 5–15)
BUN: 14 mg/dL (ref 6–20)
BUN: 19 mg/dL (ref 6–20)
CHLORIDE: 106 mmol/L (ref 101–111)
CO2: 24 mmol/L (ref 22–32)
CO2: 25 mmol/L (ref 22–32)
CREATININE: 0.79 mg/dL (ref 0.44–1.00)
Calcium: 9.5 mg/dL (ref 8.9–10.3)
Calcium: 9.5 mg/dL (ref 8.9–10.3)
Chloride: 103 mmol/L (ref 101–111)
Creatinine, Ser: 0.68 mg/dL (ref 0.44–1.00)
GFR calc Af Amer: >60 mL/min (ref >60)
GFR calc non Af Amer: >60 mL/min (ref >60)
GFR calc non Af Amer: >60 mL/min (ref >60)
GLUCOSE: 133 mg/dL — AB (ref 65–99)
GLUCOSE: 166 mg/dL — AB (ref 65–99)
POTASSIUM: 4 mmol/L (ref 3.5–5.1)
Potassium: 4 mmol/L (ref 3.5–5.1)
SODIUM: 140 mmol/L (ref 135–145)
Sodium: 136 mmol/L (ref 135–145)

## 2015-05-21 LAB — CBC WITH DIFFERENTIAL/PLATELET
BASOS ABS: 0 10*3/uL (ref 0.0–0.1)
Basophils Relative: 0 %
Eosinophils Absolute: 0.2 10*3/uL (ref 0.0–0.7)
Eosinophils Relative: 2 %
HEMATOCRIT: 37.6 % (ref 36.0–46.0)
HEMOGLOBIN: 12.6 g/dL (ref 12.0–15.0)
Lymphocytes Relative: 37 %
Lymphs Abs: 2.6 10*3/uL (ref 0.7–4.0)
MCH: 31 pg (ref 26.0–34.0)
MCHC: 33.5 g/dL (ref 30.0–36.0)
MCV: 92.6 fL (ref 78.0–100.0)
Monocytes Absolute: 0.6 10*3/uL (ref 0.1–1.0)
Monocytes Relative: 9 %
NEUTROS ABS: 3.6 10*3/uL (ref 1.7–7.7)
NEUTROS PCT: 52 %
Platelets: 190 10*3/uL (ref 150–400)
RBC: 4.06 MIL/uL (ref 3.87–5.11)
RDW: 12.5 % (ref 11.5–15.5)
WBC: 7 10*3/uL (ref 4.0–10.5)

## 2015-05-21 LAB — CBC
HCT: 36.3 % (ref 36.0–46.0)
Hemoglobin: 12 g/dL (ref 12.0–15.0)
MCH: 30.6 pg (ref 26.0–34.0)
MCHC: 33.1 g/dL (ref 30.0–36.0)
MCV: 92.6 fL (ref 78.0–100.0)
PLATELETS: 168 10*3/uL (ref 150–400)
RBC: 3.92 MIL/uL (ref 3.87–5.11)
RDW: 12.5 % (ref 11.5–15.5)
WBC: 6.4 10*3/uL (ref 4.0–10.5)

## 2015-05-21 LAB — ETHANOL

## 2015-05-21 MED ORDER — METFORMIN HCL ER 500 MG PO TB24
500.0000 mg | ORAL_TABLET | Freq: Two times a day (BID) | ORAL | Status: DC
  Administered 2015-05-21: 500 mg via ORAL
  Filled 2015-05-21 (×2): qty 1

## 2015-05-21 MED ORDER — ASPIRIN EC 81 MG PO TBEC
81.0000 mg | DELAYED_RELEASE_TABLET | Freq: Every day | ORAL | Status: DC
Start: 2015-05-22 — End: 2015-05-22
  Filled 2015-05-21: qty 1

## 2015-05-21 MED ORDER — CIPROFLOXACIN HCL 500 MG PO TABS
500.0000 mg | ORAL_TABLET | Freq: Two times a day (BID) | ORAL | Status: DC
  Administered 2015-05-21 – 2015-05-22 (×2): 500 mg via ORAL
  Filled 2015-05-21 (×2): qty 1

## 2015-05-21 MED ORDER — AMITRIPTYLINE HCL 25 MG PO TABS
25.0000 mg | ORAL_TABLET | Freq: Every day | ORAL | Status: DC
  Administered 2015-05-21: 25 mg via ORAL
  Filled 2015-05-21: qty 1

## 2015-05-21 MED ORDER — CHLORDIAZEPOXIDE HCL 5 MG PO CAPS: 5.0000 mg | ORAL_CAPSULE | Freq: Every day | ORAL | Status: DC

## 2015-05-21 MED ORDER — LORAZEPAM 0.5 MG PO TABS
0.5000 mg | ORAL_TABLET | Freq: Once | ORAL | Status: AC
  Administered 2015-05-21: 0.5 mg via ORAL
  Filled 2015-05-21: qty 1

## 2015-05-21 MED ORDER — VITAMIN E 180 MG (400 UNIT) PO CAPS
400.0000 [IU] | ORAL_CAPSULE | Freq: Every day | ORAL | Status: DC
  Administered 2015-05-22: 400 [IU] via ORAL
  Filled 2015-05-21: qty 1

## 2015-05-21 MED ORDER — ATORVASTATIN CALCIUM 10 MG PO TABS
20.0000 mg | ORAL_TABLET | Freq: Every day | ORAL | Status: DC
  Filled 2015-05-21 (×2): qty 2

## 2015-05-21 MED ORDER — CALCIUM CARBONATE 1250 (500 CA) MG PO TABS
1.0000 | ORAL_TABLET | Freq: Two times a day (BID) | ORAL | Status: DC
  Administered 2015-05-22: 500 mg via ORAL
  Filled 2015-05-21 (×3): qty 1

## 2015-05-21 NOTE — ED Notes (Signed)
Pt states that she has had a lot going on in the past year with recent loss of brother, and husband having a stroke who she takes care of at home, pt has followed up with PCP who has been changing depression medications around, and pt is now feeling more depressed and anxious. Pt denies SI/HI/AVH, but states that she "does not want to have to live like this anymore" Pt [facility] and was told to come here for a referral.

## 2015-05-21 NOTE — ED Notes (Signed)
[facility], PTA received 324 ASA and one nitro, bringing CP from 8 to a 1. Pt has hx of anxiety and describes pain as a burning, pt currently taking cipro for UTI.

## 2015-05-21 NOTE — ED Provider Notes (Signed)
CSN: [id number]     Arrival date & time 05/21/15  1452 History  By signing my name below, I, Ronney Lion, attest that this documentation has been prepared under the direction and in the presence of S. Lane Hacker, PA-C. Electronically Signed: Ronney Lion, ED Scribe. 05/21/2015. 10:33 PM.    Chief Complaint  Patient presents with  . Depression   The history is provided by the patient. No language interpreter was used.   HPI Comments: Stacy Orozco is a 70 y.o. female with a history of depression, diet-controlled DM, hyperlipidemia, and hyperlipemia, presents to the Emergency Department complaining of depression that had been exacerbated about 1-2 months ago with recent loss of brother and father, and husband having a stroke. Patient states her PCP had been switching her medications, but she states she is experiencing continuing and increasing anxiety and depression. She states she had been taking Celexa, which had helped her, but she had stopped taking it due to adverse side effects. Patient reports she had taken amitriptyline 5 mg at night, but she had increased the dosage to 25 mg about 6 days ago. She had also been taking Libirum 5 mg. Patient reports she came in today due to intolerability of her symptoms. Patient denies SI, but states, "I don't want to live like this." However, she adds, "I want to live, I just want to stop feeling like this." Patient states she has not seen a psychiatrist.   Patient also complains of headache and chest pain exacerbated by her anxiety. She has been at the ED 2 times over the past week for this but continues to have symptoms. Patient states she is unable to sleep secondary to her headache. She states her chest pain is exacerbated whenever she becomes upset. She had tried Ambien over 3 nights with no relief. She denies a history of smoking. She denies a history of prior MI or family history of CAD <55. She denies diarrhea, nausea, vomiting, abdominal pain.   Patient  reports she had a UTI and has been taking Cipro, which she started 1 week ago. However, she reports continuing dysuria.   Past Medical History  Diagnosis Date  . Diabetes mellitus without complication (HCC)     diet controlled  . Hypercholesteremia   . Depression   . Hyperlipemia    Past Surgical History  Procedure Laterality Date  . Tubal ligation     No family history on file. Social History  Substance Use Topics  . Smoking status: Never Smoker   . Smokeless tobacco: None  . Alcohol Use: No   OB History    No data available     Review of Systems  Cardiovascular: Positive for chest pain.  Genitourinary: Positive for dysuria.  Neurological: Positive for headaches.  Psychiatric/Behavioral: Positive for depression and dysphoric mood. Negative for suicidal ideas. The patient is nervous/anxious.       Allergies  Penicillins  Home Medications   Prior to Admission medications   Medication Sig Start Date End Date Taking? Authorizing Provider  amitriptyline (ELAVIL) 25 MG tablet Take 25 mg by mouth at bedtime. 05/15/15   Historical Provider, MD  aspirin EC 81 MG tablet Take 81 mg by mouth daily.    Historical Provider, MD  atorvastatin (LIPITOR) 20 MG tablet Take 20 mg by mouth daily.    Historical Provider, MD  calcium carbonate (OS-CAL) 600 MG TABS tablet Take 600 mg by mouth 2 (two) times daily with a meal.    Historical Provider,  MD  chlordiazePOXIDE (LIBRIUM) 5 MG capsule Take 5 mg by mouth at bedtime.    Historical Provider, MD  ciprofloxacin (CIPRO) 500 MG tablet Take 500 mg by mouth 2 (two) times daily. 05/15/15   Historical Provider, MD  HYDROcodone-acetaminophen (NORCO/VICODIN) 5-325 MG tablet Take 1 tablet by mouth every 6 (six) hours as needed for moderate pain or severe pain.  05/11/15   Historical Provider, MD  metFORMIN (GLUCOPHAGE-XR) 500 MG 24 hr tablet Take 500 mg by mouth 2 (two) times daily. 04/24/15   Historical Provider, MD  vitamin E 400 UNIT capsule Take  400 Units by mouth daily.    Historical Provider, MD   BP 147/79 mmHg  Pulse 86  Temp(Src) 98.4 F (36.9 C) (Oral)  SpO2 99% Physical Exam  Constitutional: She is oriented to person, place, and time. She appears well-developed and well-nourished. No distress.  HENT:  Head: Normocephalic and atraumatic.  Eyes: Conjunctivae and EOM are normal.  Neck: Neck supple. No tracheal deviation present.  Cardiovascular: Normal rate.   Pulmonary/Chest: Effort normal. No respiratory distress.  Musculoskeletal: Normal range of motion.  Neurological: She is alert and oriented to person, place, and time.  Skin: Skin is warm and dry.  Psychiatric: She has a normal mood and affect. Her behavior is normal.  Nursing note and vitals reviewed.   ED Course  Procedures   DIAGNOSTIC STUDIES: Oxygen Saturation is 99% on RA, normal by my interpretation.    COORDINATION OF CARE: 10:20 PM - Discussed treatment plan with pt at bedside which includes TTS consultation and UA. Pt verbalized understanding and agreed to plan.   Labs Review Labs Reviewed  BASIC METABOLIC PANEL - Abnormal; Notable for the following:    Glucose, Bld 166 (*)    All other components within normal limits  CBC WITH DIFFERENTIAL/PLATELET  ETHANOL  Rosezena SensorI-STAT TROPOININ, ED    Imaging Review Dg Chest 2 View  05/21/2015  CLINICAL DATA:  Patient with anxiety and depression. EXAM: CHEST  2 VIEW COMPARISON:  Chest radiograph 05/21/2015. FINDINGS: The heart size and mediastinal contours are within normal limits. Both lungs are clear. The visualized skeletal structures are unremarkable. IMPRESSION: No active cardiopulmonary disease. Electronically Signed   By: Annia Beltrew  Demps M.D.   On: 05/21/2015 21:56   Dg Chest 2 View  05/21/2015  CLINICAL DATA:  Substernal chest pain EXAM: CHEST  2 VIEW COMPARISON:  12/14/2012 FINDINGS: Normal heart size and mediastinal contours. No acute infiltrate or edema. No effusion or pneumothorax. No acute osseous  findings. IMPRESSION: Negative chest. Electronically Signed   By: Marnee SpringJonathon  Watts M.D.   On: 05/21/2015 00:59   I have personally reviewed and evaluated these images and lab results as part of my medical decision-making.   EKG Interpretation   Date/Time:  Monday May 21 2015 21:32:39 EDT Ventricular Rate:  66 PR Interval:  178 QRS Duration: 84 QT Interval:  392 QTC Calculation: 410 R Axis:   32 Text Interpretation:  Normal sinus rhythm with sinus arrhythmia Normal ECG  No significant change was found Confirmed by Manus GunningANCOUR  MD, STEPHEN ([id number])  on 05/21/2015 11:51:28 PM      MDM   Final diagnoses:  Palpitations  Anxiety   Patient non-toxic appearing, VSS. Patient presenting with recurrent depression. TTS consulted. Psych will see in AM. Will work up for cardiac etiology for chest pain; however, this is less likely, given patient is low-risk and she has atypical CP.   CXR, EKG, CMP, CBC, ETOH, troponin x 1,  UA unremarkable. Pending at shift change: delta troponin, then patient may be medically cleared. Shift change handoff to Marlon Pel, PA-C. Case discussed with Dr. Manus Gunning who agrees with above plan.  I personally performed the services described in this documentation, which was scribed in my presence. The recorded information has been reviewed and is accurate.   Melton Krebs, PA-C 06/01/15 1914  Glynn Octave, MD 06/01/15 [phone]

## 2015-05-21 NOTE — Discharge Instructions (Signed)
Work with your doctor to find a depression medication that does not give you headaches or chest pains.  Nonspecific Chest Pain  Chest pain can be caused by many different conditions. There is always a chance that your pain could be related to something serious, such as a heart attack or a blood clot in your lungs. Chest pain can also be caused by conditions that are not life-threatening. If you have chest pain, it is very important to follow up with your health care provider. CAUSES  Chest pain can be caused by:  Heartburn.  Pneumonia or bronchitis.  Anxiety or stress.  Inflammation around your heart (pericarditis) or lung (pleuritis or pleurisy).  A blood clot in your lung.  A collapsed lung (pneumothorax). It can develop suddenly on its own (spontaneous pneumothorax) or from trauma to the chest.  Shingles infection (varicella-zoster virus).  Heart attack.  Damage to the bones, muscles, and cartilage that make up your chest wall. This can include:  Bruised bones due to injury.  Strained muscles or cartilage due to frequent or repeated coughing or overwork.  Fracture to one or more ribs.  Sore cartilage due to inflammation (costochondritis). RISK FACTORS  Risk factors for chest pain may include:  Activities that increase your risk for trauma or injury to your chest.  Respiratory infections or conditions that cause frequent coughing.  Medical conditions or overeating that can cause heartburn.  Heart disease or family history of heart disease.  Conditions or health behaviors that increase your risk of developing a blood clot.  Having had chicken pox (varicella zoster). SIGNS AND SYMPTOMS Chest pain can feel like:  Burning or tingling on the surface of your chest or deep in your chest.  Crushing, pressure, aching, or squeezing pain.  Dull or sharp pain that is worse when you move, cough, or take a deep breath.  Pain that is also felt in your back, neck, shoulder,  or arm, or pain that spreads to any of these areas. Your chest pain may come and go, or it may stay constant. DIAGNOSIS Lab tests or other studies may be needed to find the cause of your pain. Your health care provider may have you take a test called an ambulatory ECG (electrocardiogram). An ECG records your heartbeat patterns at the time the test is performed. You may also have other tests, such as:  Transthoracic echocardiogram (TTE). During echocardiography, sound waves are used to create a picture of all of the heart structures and to look at how blood flows through your heart.  Transesophageal echocardiogram (TEE).This is a more advanced imaging test that obtains images from inside your body. It allows your health care provider to see your heart in finer detail.  Cardiac monitoring. This allows your health care provider to monitor your heart rate and rhythm in real time.  Holter monitor. This is a portable device that records your heartbeat and can help to diagnose abnormal heartbeats. It allows your health care provider to track your heart activity for several days, if needed.  Stress tests. These can be done through exercise or by taking medicine that makes your heart beat more quickly.  Blood tests.  Imaging tests. TREATMENT  Your treatment depends on what is causing your chest pain. Treatment may include:  Medicines. These may include:  Acid blockers for heartburn.  Anti-inflammatory medicine.  Pain medicine for inflammatory conditions.  Antibiotic medicine, if an infection is present.  Medicines to dissolve blood clots.  Medicines to treat coronary artery  disease.  Supportive care for conditions that do not require medicines. This may include:  Resting.  Applying heat or cold packs to injured areas.  Limiting activities until pain decreases. HOME CARE INSTRUCTIONS  If you were prescribed an antibiotic medicine, finish it all even if you start to feel  better.  Avoid any activities that bring on chest pain.  Do not use any tobacco products, including cigarettes, chewing tobacco, or electronic cigarettes. If you need help quitting, ask your health care provider.  Do not drink alcohol.  Take medicines only as directed by your health care provider.  Keep all follow-up visits as directed by your health care provider. This is important. This includes any further testing if your chest pain does not go away.  If heartburn is the cause for your chest pain, you may be told to keep your head raised (elevated) while sleeping. This reduces the chance that acid will go from your stomach into your esophagus.  Make lifestyle changes as directed by your health care provider. These may include:  Getting regular exercise. Ask your health care provider to suggest some activities that are safe for you.  Eating a heart-healthy diet. A registered dietitian can help you to learn healthy eating options.  Maintaining a healthy weight.  Managing diabetes, if necessary.  Reducing stress. SEEK MEDICAL CARE IF:  Your chest pain does not go away after treatment.  You have a rash with blisters on your chest.  You have a fever. SEEK IMMEDIATE MEDICAL CARE IF:   Your chest pain is worse.  You have an increasing cough, or you cough up blood.  You have severe abdominal pain.  You have severe weakness.  You faint.  You have chills.  You have sudden, unexplained chest discomfort.  You have sudden, unexplained discomfort in your arms, back, neck, or jaw.  You have shortness of breath at any time.  You suddenly start to sweat, or your skin gets clammy.  You feel nauseous or you vomit.  You suddenly feel light-headed or dizzy.  Your heart begins to beat quickly, or it feels like it is skipping beats. These symptoms may represent a serious problem that is an emergency. Do not wait to see if the symptoms will go away. Get medical help right away.  Call your local emergency services (911 in the [place] Do not drive yourself to the hospital.   This information is not intended to replace advice given to you by your health care provider. Make sure you discuss any questions you have with your health care provider.   Document Released: 10/23/2004 Document Revised: 02/03/2014 Document Reviewed: 08/19/2013 Elsevier Interactive Patient Education 2016 Elsevier Inc.  General Headache Without Cause A headache is pain or discomfort felt around the head or neck area. The specific cause of a headache may not be found. There are many causes and types of headaches. A few common ones are:  Tension headaches.  Migraine headaches.  Cluster headaches.  Chronic daily headaches. HOME CARE INSTRUCTIONS  Watch your condition for any changes. Take these steps to help with your condition: Managing Pain  Take over-the-counter and prescription medicines only as told by your health care provider.  Lie down in a dark, quiet room when you have a headache.  If directed, apply ice to the head and neck area:  Put ice in a plastic bag.  Place a towel between your skin and the bag.  Leave the ice on for 20 minutes, 2-3 times per day.  Use a heating pad or hot shower to apply heat to the head and neck area as told by your health care provider.  Keep lights dim if bright lights bother you or make your headaches worse. Eating and Drinking  Eat meals on a regular schedule.  Limit alcohol use.  Decrease the amount of caffeine you drink, or stop drinking caffeine. General Instructions  Keep all follow-up visits as told by your health care provider. This is important.  Keep a headache journal to help find out what may trigger your headaches. For example, write down:  What you eat and drink.  How much sleep you get.  Any change to your diet or medicines.  Try massage or other relaxation techniques.  Limit stress.  Sit up straight, and do not  tense your muscles.  Do not use tobacco products, including cigarettes, chewing tobacco, or e-cigarettes. If you need help quitting, ask your health care provider.  Exercise regularly as told by your health care provider.  Sleep on a regular schedule. Get 7-9 hours of sleep, or the amount recommended by your health care provider. SEEK MEDICAL CARE IF:   Your symptoms are not helped by medicine.  You have a headache that is different from the usual headache.  You have nausea or you vomit.  You have a fever. SEEK IMMEDIATE MEDICAL CARE IF:   Your headache becomes severe.  You have repeated vomiting.  You have a stiff neck.  You have a loss of vision.  You have problems with speech.  You have pain in the eye or ear.  You have muscular weakness or loss of muscle control.  You lose your balance or have trouble walking.  You feel faint or pass out.  You have confusion.   This information is not intended to replace advice given to you by your health care provider. Make sure you discuss any questions you have with your health care provider.   Document Released: 01/13/2005 Document Revised: 10/04/2014 Document Reviewed: 05/08/2014 Elsevier Interactive Patient Education [employer]2016 Elsevier Inc.

## 2015-05-21 NOTE — ED Provider Notes (Signed)
CSN: [id number]     Arrival date & time 05/20/15  2355 History  By signing my name below, I, Stacy Orozco, attest that this documentation has been prepared under the direction and in the presence of Stacy Booze, MD . Electronically Signed: Freida Orozco, Scribe. 05/21/2015. 12:30 AM.    Chief Complaint  Patient presents with  . Chest Pain   The history is provided by the patient. No language interpreter was used.   HPI Comments:  Stacy Orozco is a 70 y.o. female with a history of DM, and HLD, who presents to the Emergency Department via EMS complaining of HA since 05/03/15. Pt notes the pain began after she started taken Lexapro.  She has taken advil and tylenol with mild relief of the HA.  She is also complaining of intermittent  non-radiating CP since 05/03/15.  She was given ASA and NTG en route with relief of her CP. Her pain was 8/10 prior to nitro and 0 at this time. She denies SOB, nausea, and diaphoresis. She notes family h/o MI- father in 35s.   Pt is also complaining of depression. She is currently being treated with lexapro and amitriptyline. She reports frequent crying, difficulty sleeping and dysphoric mood. Pt denies auditory and visual hallucinations.   Past Medical History  Diagnosis Date  . Diabetes mellitus without complication (HCC)     diet controlled  . Hypercholesteremia   . Depression   . Hyperlipemia    Past Surgical History  Procedure Laterality Date  . Tubal ligation     No family history on file. Social History  Substance Use Topics  . Smoking status: Never Smoker   . Smokeless tobacco: None  . Alcohol Use: No   OB History    No data available     Review of Systems  Constitutional: Negative for diaphoresis.  Respiratory: Negative for shortness of breath.   Cardiovascular: Positive for chest pain.  Gastrointestinal: Negative for nausea.  Neurological: Positive for headaches.  Psychiatric/Behavioral: Positive for dysphoric mood. Negative for  hallucinations.  All other systems reviewed and are negative.  Allergies  Penicillins  Home Medications   Prior to Admission medications   Medication Sig Start Date End Date Taking? Authorizing Provider  amitriptyline (ELAVIL) 25 MG tablet Take 25 mg by mouth at bedtime. 05/15/15   Historical Provider, MD  aspirin EC 81 MG tablet Take 81 mg by mouth daily.    Historical Provider, MD  atorvastatin (LIPITOR) 20 MG tablet Take 20 mg by mouth daily.    Historical Provider, MD  calcium carbonate (OS-CAL) 600 MG TABS tablet Take 600 mg by mouth 2 (two) times daily with a meal.    Historical Provider, MD  chlordiazePOXIDE (LIBRIUM) 5 MG capsule Take 5 mg by mouth at bedtime.    Historical Provider, MD  ciprofloxacin (CIPRO) 500 MG tablet Take 500 mg by mouth 2 (two) times daily. 05/15/15   Historical Provider, MD  HYDROcodone-acetaminophen (NORCO/VICODIN) 5-325 MG tablet Take 1 tablet by mouth every 6 (six) hours as needed for moderate pain or severe pain.  05/11/15   Historical Provider, MD  metFORMIN (GLUCOPHAGE-XR) 500 MG 24 hr tablet Take 500 mg by mouth 2 (two) times daily. 04/24/15   Historical Provider, MD  vitamin E 400 UNIT capsule Take 400 Units by mouth daily.    Historical Provider, MD   BP 141/80 mmHg  Pulse 79  Temp(Src) 97.6 F (36.4 C) (Oral)  Resp 18  SpO2 98% Physical Exam  Constitutional:  She is oriented to person, place, and time. She appears well-developed and well-nourished. No distress.  HENT:  Head: Normocephalic and atraumatic.  Eyes: Conjunctivae are normal. Pupils are equal, round, and reactive to light.  Neck: Normal range of motion. Neck supple. No JVD present.  Cardiovascular: Normal rate, regular rhythm and normal heart sounds.   No murmur heard. Pulmonary/Chest: Effort normal and breath sounds normal. She has no wheezes. She has no rales. She exhibits no tenderness.  Abdominal: Soft. Bowel sounds are normal. She exhibits no distension and no mass. There is no  tenderness.  Musculoskeletal: Normal range of motion. She exhibits no edema.  Lymphadenopathy:    She has no cervical adenopathy.  Neurological: She is alert and oriented to person, place, and time. No cranial nerve deficit. She exhibits normal muscle tone. Coordination normal.  Skin: Skin is warm and dry. No rash noted.  Psychiatric: She exhibits a depressed mood.  Nursing note and vitals reviewed.   ED Course  Procedures   DIAGNOSTIC STUDIES:  Oxygen Saturation is 98% on RA, normal by my interpretation.    COORDINATION OF CARE:  12:29 AM Discussed treatment plan with pt at bedside and pt agreed to plan.  Labs Review Results for orders placed or performed during the hospital encounter of 05/20/15  Basic metabolic panel  Result Value Ref Range   Sodium 140 135 - 145 mmol/L   Potassium 4.0 3.5 - 5.1 mmol/L   Chloride 106 101 - 111 mmol/L   CO2 24 22 - 32 mmol/L   Glucose, Bld 133 (H) 65 - 99 mg/dL   BUN 19 6 - 20 mg/dL   Creatinine, Ser 1.61 0.44 - 1.00 mg/dL   Calcium 9.5 8.9 - 09.6 mg/dL   GFR calc non Af Amer >60 >60 mL/min   GFR calc Af Amer >60 >60 mL/min   Anion gap 10 5 - 15  CBC  Result Value Ref Range   WBC 6.4 4.0 - 10.5 K/uL   RBC 3.92 3.87 - 5.11 MIL/uL   Hemoglobin 12.0 12.0 - 15.0 g/dL   HCT 04.5 40.9 - 81.1 %   MCV 92.6 78.0 - 100.0 fL   MCH 30.6 26.0 - 34.0 pg   MCHC 33.1 30.0 - 36.0 g/dL   RDW 91.4 78.2 - 95.6 %   Platelets 168 150 - 400 K/uL  I-stat troponin, ED  Result Value Ref Range   Troponin i, poc 0.00 0.00 - 0.08 ng/mL   Comment 3          I-stat troponin, ED  Result Value Ref Range   Troponin i, poc 0.00 0.00 - 0.08 ng/mL   Comment 3           Imaging Review Dg Chest 2 View  05/21/2015  CLINICAL DATA:  Substernal chest pain EXAM: CHEST  2 VIEW COMPARISON:  12/14/2012 FINDINGS: Normal heart size and mediastinal contours. No acute infiltrate or edema. No effusion or pneumothorax. No acute osseous findings. IMPRESSION: Negative chest.  Electronically Signed   By: Marnee Spring M.D.   On: 05/21/2015 00:59   I have personally reviewed and evaluated these images and lab results as part of my medical decision-making.   EKG Interpretation   Date/Time:  Monday May 21 2015 00:08:00 EDT Ventricular Rate:  61 PR Interval:  187 QRS Duration: 95 QT Interval:  396 QTC Calculation: 399 R Axis:   29 Text Interpretation:  Sinus rhythm When compared with ECG of 12/04/2012, No  significant change  was found Confirmed by Westpark SpringsGLICK  MD, Davelle Anselmi ([id number]) on  05/21/2015 12:10:44 AM      MDM   Final diagnoses:  Chest pain, unspecified chest pain type  Headache, unspecified headache type  Depression    Chest pain of uncertain cause. She got complete relief with nitroglycerin in the ambulance, but pattern of pain is not suggestive of cardiac disease. Pain has been constant since starting on medication for depression and has been associated with a headache which also came on with that medication. ECG is normal and troponin is normal. She is kept in the ED for repeat troponin which is also normal. She Has continued to be completely pain-free in the ED. Old records are reviewed and she had an ED visit in 2014 for atypical chest pain. She is referred back to her primary care physician to try to adjust her depression medications to find one that does not cause chest pain and headaches.  I personally performed the services described in this documentation, which was scribed in my presence. The recorded information has been reviewed and is accurate.      Stacy Boozeavid Jacque Garrels, MD 05/21/15 [phone]

## 2015-05-21 NOTE — ED Notes (Signed)
Pt expressing hopelessness and increased depression. Pt c/o feeling recurring anxiety, chest discomfort, and headache. Pt has been to the hospital multiple times in the past week and has had her physical symptoms addressed but feels like her depression has not been addressed. Pt states she has reached out to her PCP for this also but has not gotten anywhere. Pt daughters are with her and state pt has had a lot of stressful things going on. Pt denies SI but states she is "tired of feeling this way and wants help for the depression"

## 2015-05-22 DIAGNOSIS — F4329 Adjustment disorder with other symptoms: Secondary | ICD-10-CM

## 2015-05-22 DIAGNOSIS — F4323 Adjustment disorder with mixed anxiety and depressed mood: Secondary | ICD-10-CM

## 2015-05-22 LAB — URINALYSIS, ROUTINE W REFLEX MICROSCOPIC
BILIRUBIN URINE: NEGATIVE
GLUCOSE, UA: NEGATIVE mg/dL
HGB URINE DIPSTICK: NEGATIVE
Ketones, ur: NEGATIVE mg/dL
Leukocytes, UA: NEGATIVE
Nitrite: NEGATIVE
Protein, ur: NEGATIVE mg/dL
SPECIFIC GRAVITY, URINE: 1.011 (ref 1.005–1.030)
pH: 5.5 (ref 5.0–8.0)

## 2015-05-22 LAB — I-STAT TROPONIN, ED: Troponin i, poc: 0 ng/mL (ref 0.00–0.08)

## 2015-05-22 MED ORDER — TRAZODONE HCL 50 MG PO TABS: 50.0000 mg | ORAL_TABLET | Freq: Every day | ORAL | Status: DC

## 2015-05-22 MED ORDER — CITALOPRAM HYDROBROMIDE 20 MG PO TABS: 20.0000 mg | ORAL_TABLET | Freq: Every day | ORAL | Status: AC

## 2015-05-22 MED ORDER — CHLORDIAZEPOXIDE HCL 5 MG PO CAPS: ORAL_CAPSULE | ORAL | Status: DC

## 2015-05-22 NOTE — ED Notes (Signed)
Patient doing TTS at this time.

## 2015-05-22 NOTE — B[facility] Counselor (Signed)
Faxed OP Resources to [phone] for pt and her family for follow-up after discharge from the ED.   Beryle FlockMary Syndi Pua, MS, CRC, [facility] Triage Specialist [facility]

## 2015-05-22 NOTE — ED Provider Notes (Signed)
Delta Trop negative. Patient medically cleared.  3: 50 am The patient has been assessed by TTS and does NOT meet inpatient criteria. Corrie DandyMary has had a long discussion with the patient regarding outpatient therapy. He recommends psychiatrist and psychiatrist. They recommend an AM - Psych MD evaluation in the morning due to this being her third visit. They anticipate that she will be able to be discharged after and will send over resources.  Marlon Peliffany Kellyanne Ellwanger, PA-C 05/22/15 [id number]  Glynn OctaveStephen Rancour, MD 05/26/15 [phone]

## 2015-05-22 NOTE — Consult Note (Signed)
Telepsych Consultation   Reason for Consult: Anxiety/Depression Referring Physician:  EDP Patient Identification: Stacy Orozco MRN:  [id number] Principal Diagnosis: Adjustment disorder with mixed emotional features Diagnosis:   Patient Active Problem List   Diagnosis Date Noted  . Adjustment disorder with mixed emotional features [F43.23] 05/22/2015    Total Time spent with patient: 30 minutes  Subjective:   Stacy Orozco is a 70 y.o. female patient admitted with symptoms of anxiety and depression stating "I have never been like this before. It all started with my family members becoming sick. The stress has been so bad. I just freak out and come to the ED. I don't think I am on the right medications. I think that I am having headaches from stress. I just don't know what to do. I am also worried about my husband. I recently lost my brother and father."   HPI:   Stacy Orozco is an 71 y.o.married female who was brought to the [facility] voluntarily tonight via EMS after feeling physically ill at home today with symptoms including headache, chest pain and fear of dying. Patient has been seen in the ED 3 times since last Tuesday for the same symptoms.Her two daughters, Stacy Orozco have been present for assessments and are very supportive of their mother. Patient denies SI, HI, SHI and AVH. Patient describes episodes in the evening where she has palpitations, feels very anxious, and does not know what to do. She denies any previous psychiatric history other than mild symptoms of depression in the past. Patient admits feeling overwhelmed by many of the situations she has dealt with stating "After my brother died we had to handle everything with his estate. Then my husband had a stroke. I feel so stressed out." Discussed with patient the need to see a Psychiatrist as she has been visiting different Primary Care Providers and receiving different medications (Lexapro, Wellbutrin, Celexa). Also discussed the  benefits of receiving short term psychotherapy for grief/loss issues. Patient encouraged to try one medication regimen for at least four weeks to evaluate for therapeutic effects. Patient requests to try Celexa again as "It helped but I only took it five days. I thought it gave me headache but now I see it's just the stress."  Patient already has a list of outpatient providers and reports she will have an appointment set up. Stacy Orozco appears very motivated to seek professional help and was very cooperative during the interview.   Past Psychiatric History: Depression  Risk to Self: Suicidal Ideation: No (denies) Suicidal Intent: No Is patient at risk for suicide?: No Suicidal Plan?: No (denies) Access to Means: Yes (does have access to weapons) Specify Access to Suicidal Means: has access to guns What has been your use of drugs/alcohol within the last 12 months?: none How many times?: 0 Other Self Harm Risks: none Triggers for Past Attempts:  (na) Intentional Self Injurious Behavior: None Risk to Others: Homicidal Ideation: No (deneis) Thoughts of Harm to Others: No (denies) Current Homicidal Intent: No (denies) Current Homicidal Plan: No (denies) Access to Homicidal Means: No (denies) Identified Victim: na History of harm to others?: No (denies) Assessment of Violence: None Noted Violent Behavior Description: na Does patient have access to weapons?: Yes (Comment) (yes) Criminal Charges Pending?: No Does patient have a court date: No Prior Inpatient Therapy: Prior Inpatient Therapy: No Prior Therapy Dates: na Prior Therapy Facilty/Provider(s): na Reason for Treatment: na Prior Outpatient Therapy: Prior Outpatient Therapy: No Prior Therapy Dates: na Prior Therapy  Facilty/Provider(s): na Reason for Treatment: na Does patient have an ACCT team?: No Does patient have Intensive In-House Services?  : No Does patient have [facility] services? : No Does patient have P4CC services?: No  Past  Medical History:  Past Medical History  Diagnosis Date  . Diabetes mellitus without complication (HCC)     diet controlled  . Hypercholesteremia   . Depression   . Hyperlipemia     Past Surgical History  Procedure Laterality Date  . Tubal ligation     Family History: No family history on file.  Social History:  History  Alcohol Use No     History  Drug Use No    Social History   Social History  . Marital Status: Married    Spouse Name: N/A  . Number of Children: N/A  . Years of Education: N/A   Social History Main Topics  . Smoking status: Never Smoker   . Smokeless tobacco: None  . Alcohol Use: No  . Drug Use: No  . Sexual Activity: Not Asked   Other Topics Concern  . None   Social History Narrative   Additional Social History:    Allergies:   Allergies  Allergen Reactions  . Penicillins Rash    Has patient had a PCN reaction causing immediate rash, facial/tongue/throat swelling, SOB or lightheadedness with hypotension:  No (pt did experience minor rash)  Has patient had a PCN reaction causing severe rash involving mucus membranes or skin necrosis: no Has patient had a PCN reaction that required hospitalization: no Has patient had a PCN reaction occurring within the last 10 years: no If all of the above answers are "NO", then may proceed with Cephalosporin use.     Labs:  Results for orders placed or performed during the hospital encounter of 05/21/15 (from the past 48 hour(s))  Ethanol     Status: None   Collection Time: 05/21/15  9:39 PM  Result Value Ref Range   Alcohol, Ethyl (B) <5 <5 mg/dL    Comment:        LOWEST DETECTABLE LIMIT FOR SERUM ALCOHOL IS 5 mg/dL FOR MEDICAL PURPOSES ONLY   CBC with Diff     Status: None   Collection Time: 05/21/15  9:39 PM  Result Value Ref Range   WBC 7.0 4.0 - 10.5 K/uL   RBC 4.06 3.87 - 5.11 MIL/uL   Hemoglobin 12.6 12.0 - 15.0 g/dL   HCT 37.6 36.0 - 46.0 %   MCV 92.6 78.0 - 100.0 fL   MCH 31.0 26.0  - 34.0 pg   MCHC 33.5 30.0 - 36.0 g/dL   RDW 12.5 11.5 - 15.5 %   Platelets 190 150 - 400 K/uL   Neutrophils Relative % 52 %   Neutro Abs 3.6 1.7 - 7.7 K/uL   Lymphocytes Relative 37 %   Lymphs Abs 2.6 0.7 - 4.0 K/uL   Monocytes Relative 9 %   Monocytes Absolute 0.6 0.1 - 1.0 K/uL   Eosinophils Relative 2 %   Eosinophils Absolute 0.2 0.0 - 0.7 K/uL   Basophils Relative 0 %   Basophils Absolute 0.0 0.0 - 0.1 K/uL  Basic metabolic panel     Status: Abnormal   Collection Time: 05/21/15  9:39 PM  Result Value Ref Range   Sodium 136 135 - 145 mmol/L   Potassium 4.0 3.5 - 5.1 mmol/L   Chloride 103 101 - 111 mmol/L   CO2 25 22 - 32 mmol/L   Glucose,  Bld 166 (H) 65 - 99 mg/dL   BUN 14 6 - 20 mg/dL   Creatinine, Ser 0.68 0.44 - 1.00 mg/dL   Calcium 9.5 8.9 - 10.3 mg/dL   GFR calc non Af Amer >60 >60 mL/min   GFR calc Af Amer >60 >60 mL/min    Comment: (NOTE) The eGFR has been calculated using the CKD EPI equation. This calculation has not been validated in all clinical situations. eGFR's persistently <60 mL/min signify possible Chronic Kidney Disease.    Anion gap 8 5 - 15  I-Stat Troponin, ED (not at [facility])     Status: None   Collection Time: 05/21/15  9:47 PM  Result Value Ref Range   Troponin i, poc 0.00 0.00 - 0.08 ng/mL   Comment 3            Comment: Due to the release kinetics of cTnI, a negative result within the first hours of the onset of symptoms does not rule out myocardial infarction with certainty. If myocardial infarction is still suspected, repeat the test at appropriate intervals.   Urinalysis, Routine w reflex microscopic (not at [facility])     Status: Abnormal   Collection Time: 05/22/15 12:36 AM  Result Value Ref Range   Color, Urine YELLOW YELLOW   APPearance CLOUDY (A) CLEAR   Specific Gravity, Urine 1.011 1.005 - 1.030   pH 5.5 5.0 - 8.0   Glucose, UA NEGATIVE NEGATIVE mg/dL   Hgb urine dipstick NEGATIVE NEGATIVE   Bilirubin Urine NEGATIVE NEGATIVE    Ketones, ur NEGATIVE NEGATIVE mg/dL   Protein, ur NEGATIVE NEGATIVE mg/dL   Nitrite NEGATIVE NEGATIVE   Leukocytes, UA NEGATIVE NEGATIVE    Comment: MICROSCOPIC NOT DONE ON URINES WITH NEGATIVE PROTEIN, BLOOD, LEUKOCYTES, NITRITE, OR GLUCOSE <1000 mg/dL.  I-Stat Troponin, ED (not at [facility])     Status: None   Collection Time: 05/22/15  3:28 AM  Result Value Ref Range   Troponin i, poc 0.00 0.00 - 0.08 ng/mL   Comment 3            Comment: Due to the release kinetics of cTnI, a negative result within the first hours of the onset of symptoms does not rule out myocardial infarction with certainty. If myocardial infarction is still suspected, repeat the test at appropriate intervals.     Current Facility-Administered Medications  Medication Dose Route Frequency Provider Last Rate Last Dose  . amitriptyline (ELAVIL) tablet 25 mg  25 mg Oral QHS [place] Lions, PA-C   25 mg at 05/21/15 2332  . aspirin EC tablet 81 mg  81 mg Oral Daily [place] Lions, PA-C      . atorvastatin (LIPITOR) tablet 20 mg  20 mg Oral Daily [place] Lions, PA-C      . calcium carbonate (OS-CAL - dosed in mg of elemental calcium) tablet 500 mg of elemental calcium  1 tablet Oral BID WC [place] Lions, PA-C      . chlordiazePOXIDE (LIBRIUM) capsule 5 mg  5 mg Oral QHS [place] Lions, PA-C      . ciprofloxacin (CIPRO) tablet 500 mg  500 mg Oral BID [place] Lions, PA-C   500 mg at 05/22/15 1038  . metFORMIN (GLUCOPHAGE-XR) 24 hr tablet 500 mg  500 mg Oral BID Sharon Lions, PA-C   500 mg at 05/21/15 2332  . vitamin E capsule 400 Units  400 Units Oral Daily [place] Lions, PA-C   400 Units at 05/22/15 1038  Current Outpatient Prescriptions  Medication Sig Dispense Refill  . amitriptyline (ELAVIL) 25 MG tablet Take 25 mg by mouth at bedtime.  5  . aspirin EC 81 MG tablet Take 81 mg by mouth daily.    Marland Kitchen atorvastatin (LIPITOR) 20 MG tablet Take 20 mg by mouth daily.     . calcium carbonate (OS-CAL) 600 MG TABS tablet Take 600 mg by mouth 2 (two) times daily with a meal.    . chlordiazePOXIDE (LIBRIUM) 5 MG capsule Take 5 mg by mouth at bedtime.    . ciprofloxacin (CIPRO) 500 MG tablet Take 500 mg by mouth 2 (two) times daily.  0  . HYDROcodone-acetaminophen (NORCO/VICODIN) 5-325 MG tablet Take 1 tablet by mouth every 6 (six) hours as needed for moderate pain or severe pain.   0  . ibuprofen (ADVIL,MOTRIN) 200 MG tablet Take 200 mg by mouth every 6 (six) hours as needed for moderate pain.    . metFORMIN (GLUCOPHAGE-XR) 500 MG 24 hr tablet Take 500 mg by mouth 2 (two) times daily.  5  . vitamin E 400 UNIT capsule Take 400 Units by mouth daily.      Musculoskeletal:  Unable to assess via camera   Psychiatric Specialty Exam: Review of Systems  Psychiatric/Behavioral: Positive for depression. The patient is nervous/anxious and has insomnia.     Blood pressure 159/70, pulse 89, temperature 98.2 F (36.8 C), temperature source Oral, resp. rate 20, SpO2 100 %.There is no weight on file to calculate BMI.  General Appearance: Neat and Well Groomed  Engineer, water::  Good  Speech:  Clear and Coherent  Volume:  Normal  Mood:  Anxious  Affect:  Congruent  Thought Process:  Goal Directed and Intact  Orientation:  Full (Time, Place, and Person)  Thought Content:  Symptoms, worries, concerns   Suicidal Thoughts:  No  Homicidal Thoughts:  No  Memory:  Immediate;   Good Recent;   Good Remote;   Good  Judgement:  Good  Insight:  Present  Psychomotor Activity:  Normal  Concentration:  Good  Recall:  Good  Fund of Knowledge:Good  Language: Good  Akathisia:  No  Handed:  Right  AIMS (if indicated):     Assets:  Communication Skills Desire for Improvement Financial Resources/Insurance Housing Intimacy Leisure Time Physical Health Resilience Social Support  ADL's:  Intact  Cognition: WNL  Sleep:      Treatment Plan Summary:  Discussed the following  medication recommendations with patient and she is in agreement:  -Discontinue Elavil -Start Trazodone 50 mg hs for insomnia with one repeat dose if not asleep in one hour.  -Start Celexa 20 mg daily for depression/anxiety -Take Librium 5 mg as needed daily for acute anxiety along with using coping skills when anxiety attack occurs (deep breathing, doing a relaxing activity)   Follow up with outpatient psychiatrist and psychotherapist as soon as possible. Patient already has list of resources that has been provided.   Disposition: No evidence of imminent risk to self or others at present.   Patient does not meet criteria for psychiatric inpatient admission. Supportive therapy provided about ongoing stressors. Discussed crisis plan, support from social network, calling 911, coming to the Emergency Department, and calling Suicide Hotline.  Elmarie Shiley, NP 05/22/2015 10:39 AM

## 2015-05-22 NOTE — ED Provider Notes (Signed)
Received care of pt AM 4/25. Pt seen last night with concern for palpitations/CP and increasing anxiety.  Delta troponins negative, ECG without acute changes. Plan is to obtain psychiatry consult for evaluation and recommendations given increasing anxiety and concern for side effects of medications.  Psychiatry reevaluated and recommended medications and rx provided. Given psychiatry resources. Recommend PCP and/or cardiology follow up for chest pain/palpitations. Pt cp free on my evaluation. Feels more relaxed having plan with psychiatry. Patient discharged in stable condition with understanding of reasons to return.   Stacy MondayErin Rhenda [place], MD 05/22/15 [phone]

## 2015-05-22 NOTE — ED Notes (Signed)
Telepsych being performed. 

## 2015-05-22 NOTE — ED Notes (Signed)
Dr Schlossman in w/pt. 

## 2015-05-22 NOTE — ED Notes (Signed)
Pt's daughter has arrived.  

## 2015-05-22 NOTE — ED Notes (Signed)
Telespch machine taken to room. Daughter w/pt.

## 2015-05-22 NOTE — ED Notes (Signed)
Contacted behavorial about patient status, [facility], at least another hour before TTS with be completed.

## 2015-05-22 NOTE — ED Notes (Signed)
TTS speaking with pt at this time. 

## 2015-05-22 NOTE — ED Notes (Signed)
Telepsych completed.  

## 2015-05-22 NOTE — BH Assessment (Addendum)
Tele Assessment Note   Stacy Orozco is an 70 y.o.married female [facility] after feeling physically ill at home today with symptoms including headache, chest pain and fear of dying or "going crazy." Pt has been seen in the ED 3 times since last Tuesday for the same symptoms.  Pt's 2 daughters, Ovidio KinWendy Evans and Aurea GraffKara Sykes, were in attendance of the assessment with their mother's permission. Pt denies SI, HI, SHI and AVH.  Pt sts that she has had a "terrible year."  Pt sts that she has been under constant stress due to being one of two primary caretakers for her dying brother, then one of two primary caretakers for her dying father and ongoing caretaking of her husband who had a stroke in 2013 and has had mood and behavioral changes recently.  Pt sts that she has taken herself on and off of anti-depressants and sleep medication (Ambien) over the last year. Pt sts her Primary Care Physician has prescribed some of the medications she is taking now. Per pt record, pt is currently on other medications for medical conditions (Diabetes and UTI.) Pt sts she has never consulted a psychiatrist or seen a therapist.  Pt sts that she has not allowed herself to grieve after "watching her brother then, her father die."  Pt is also grieving for losses in her own marriage with the residual losses from her husband's stroke. Pt sts she "does not want to live like this."  Pt explained that her statement was not an indication of SI but that she wants medications and treatments that work to [employer]viate her symptoms. Symptoms of depression include deep sadness, fatigue, excessive guilt, decreased self esteem, tearfulness & crying spells, self isolation, lack of motivation for activities and pleasure, irritability, negative outlook, difficulty thinking & concentrating, feeling helpless and hopeless, sleep and eating disturbances. Symptoms of anxiety include panic attacks, intrusive thoughts,  excessive worry, restlessness, hypervigilance, difficulty concentrating, irritability, and sleep disturbances.  Pt sts she lives with her husband of 50+ years. Pt sts she has no present of past legal issues and no hx of abuse: physical, emotional/verbal or sexual. Pt sts she has not been sleeping well recently but sts with an Ambien she gets 8 hours of sleep. Pt sts she has not been eating well recently due to loss of appetite and thinks she has lost about 5 pounds in the last month. Pt sts there is no family hx of psychiatric issues.  Pt sts there is no hx of recreational drug or alcohol use. BAL and UDS were all clear in the ED tonight when tested. Pt sts there are no legal issues, past or present. Pt sts she does have access to firearms but daughters are aware and watchful.   Pt was dressed in appropriate, modest street clothes and sitting on her hospital bed. Pt was alert, cooperative and pleasant. Pt kept good eye contact, spoke in a clear tone and at a normal pace. Pt moved in a normal manner when moving. Pt's thought process was coherent and relevant and judgement was impaired.  No indication of delusional thinking or response to internal stimuli. Pt's mood was stated to be depressed and anxious and her blunted affect was congruent.  Pt was oriented x 4, to person, place, time and situation.    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 Diabetes mellitus without  complication (HCC)     diet controlled  . Hypercholesteremia   . Depression   . Hyperlipemia     Past Surgical History  Procedure Laterality Date  . Tubal ligation      Family History: No family history on file.  Social History:  reports that she has never smoked. She does not have any smokeless tobacco history on file. She reports that she does not drink alcohol or use illicit drugs.  Additional Social History:  Alcohol / Drug  Use Prescriptions: See PTA list History of alcohol / drug use?: No history of alcohol / drug abuse  CIWA: CIWA-Ar BP: 147/79 mmHg Pulse Rate: 86 COWS:    PATIENT STRENGTHS: (choose at least two) Average or above average intelligence Capable of independent living Communication skills Supportive family/friends  Allergies:  Allergies  Allergen Reactions  . Penicillins Rash    Has patient had a PCN reaction causing immediate rash, facial/tongue/throat swelling, SOB or lightheadedness with hypotension:  No (pt did experience minor rash)  Has patient had a PCN reaction causing severe rash involving mucus membranes or skin necrosis: no Has patient had a PCN reaction that required hospitalization: no Has patient had a PCN reaction occurring within the last 10 years: no If all of the above answers are "NO", then may proceed with Cephalosporin use.     Home Medications:  (Not in a hospital admission)  OB/GYN Status:  No LMP recorded. Patient is postmenopausal.  General Assessment Data Location of Assessment: [facility] ED TTS Assessment: In system Is this a Tele or Face-to-Face Assessment?: Tele Assessment Is this an Initial Assessment or a Re-assessment for this encounter?: Initial Assessment Marital status: Married Dil[place] name: Arita Miss Is patient pregnant?: No Pregnancy Status: No Living Arrangements: Spouse/significant other Can pt return to current living arrangement?: Yes Admission Status: Voluntary Is patient capable of signing voluntary admission?: Yes Referral Source: Self/Family/Friend Insurance type: Scientist, research (physical sciences) Exam [facility] Walk-in ONLY) Medical Exam completed: Yes  Crisis Care Plan Living Arrangements: Spouse/significant other Name of Psychiatrist: none (Primary Care prescribing meds) Name of Therapist: none  Education Status Is patient currently in school?: No Current Grade: na Highest grade of school patient has completed: 12 (graduated) Name of school:  na Contact person: na  Risk to self with the past 6 months Suicidal Ideation: No (denies) Has patient been a risk to self within the past 6 months prior to admission? : No (denies) Suicidal Intent: No Has patient had any suicidal intent within the past 6 months prior to admission? : No Is patient at risk for suicide?: No Suicidal Plan?: No (denies) Has patient had any suicidal plan within the past 6 months prior to admission? : No Access to Means: Yes (does have access to weapons) Specify Access to Suicidal Means: has access to guns What has been your use of drugs/alcohol within the last 12 months?: none Previous Attempts/Gestures: No How many times?: 0 Other Self Harm Risks: none Triggers for Past Attempts:  (na) Intentional Self Injurious Behavior: None Family Suicide History: Unknown Recent stressful life event(s): Loss (Comment) (Loss of brother, father amd husband is ill) Persecutory voices/beliefs?: No Depression: Yes Depression Symptoms: Insomnia, Tearfulness, Loss of interest in usual pleasures, Isolating, Fatigue, Guilt, Feeling worthless/self pity, Feeling angry/irritable Substance abuse history and/or treatment for substance abuse?: Yes Suicide prevention information given to non-admitted patients: Not applicable  Risk to Others within the past 6 months Homicidal Ideation: No (deneis) Does patient have any lifetime risk of violence toward others  beyond the six months prior to admission? : No (denies) Thoughts of Harm to Others: No (denies) Current Homicidal Intent: No (denies) Current Homicidal Plan: No (denies) Access to Homicidal Means: No (denies) Identified Victim: na History of harm to others?: No (denies) Assessment of Violence: None Noted Violent Behavior Description: na Does patient have access to weapons?: Yes (Comment) (yes) Criminal Charges Pending?: No Does patient have a court date: No Is patient on probation?: No  Psychosis Hallucinations: None  noted (denies) Delusions: None noted  Mental Status Report Appearance/Hygiene: Unremarkable (street clothes) Eye Contact: Good Motor Activity: Freedom of movement, Unremarkable Speech: Logical/coherent, Unremarkable Level of Consciousness: Alert Mood: Depressed, Anxious, Pleasant Affect: Anxious, Depressed Anxiety Level: Severe Thought Processes: Coherent, Relevant Judgement: Impaired Orientation: Person, Place, Time, Situation Obsessive Compulsive Thoughts/Behaviors: None  Cognitive Functioning Concentration: Poor Memory: Recent Intact, Remote Intact IQ: Average Insight: Fair Impulse Control: Fair Appetite: Poor Weight Loss: 0 (appetite is poor) Weight Gain: 0 Sleep: Decreased Total Hours of Sleep: 8 (takes Ambien not prescribed for her) Vegetative Symptoms: None  ADLScreening [facility] Assessment Services) Patient's cognitive ability adequate to safely complete daily activities?: Yes Patient able to express need for assistance with ADLs?: Yes Independently performs ADLs?: Yes (appropriate for developmental age)  Prior Inpatient Therapy Prior Inpatient Therapy: No Prior Therapy Dates: na Prior Therapy Facilty/Provider(s): na Reason for Treatment: na  Prior Outpatient Therapy Prior Outpatient Therapy: No Prior Therapy Dates: na Prior Therapy Facilty/Provider(s): na Reason for Treatment: na Does patient have an ACCT team?: No Does patient have Intensive In-House Services?  : No Does patient have [facility] services? : No Does patient have P4CC services?: No  ADL Screening (condition at time of admission) Patient's cognitive ability adequate to safely complete daily activities?: Yes Patient able to express need for assistance with ADLs?: Yes Independently performs ADLs?: Yes (appropriate for developmental age)       Abuse/Neglect Assessment (Assessment to be complete while patient is alone) Physical Abuse: Denies Verbal Abuse: Denies Sexual Abuse:  Denies Exploitation of patient/patient's resources: Denies Self-Neglect: Denies     Merchant navy officer (For Healthcare) Does patient have an advance directive?: Yes Would patient like information on creating an advanced directive?: No - patient declined information Type of Advance Directive:  (Pt is not sure which she has) Does patient want to make changes to advanced directive?: No - Patient declined    Additional Information 1:1 In Past 12 Months?: No CIRT Risk: No Elopement Risk: No Does patient have medical clearance?: Yes     Disposition:  Disposition Initial Assessment Completed for this Encounter: Yes Disposition of Patient: Other dispositions (Pending review w [facility] Extender) Other disposition(s): Other (Comment)  Per Donell Sievert, PA: Pt does not meet IP criteria.  Recommend observe overnight and a re-evaluation by psychiatry in the morning. Will fax OP Resources for follow-up by pt and family.   Spoke w Marlon Pel, PA-C, at [facility]: Advised of recommendation.  She sts she agrees.  Beryle Flock, MS, CRC, [facility] Triage Specialist [facility] T 05/22/2015 3:24 AM

## 2015-05-22 NOTE — ED Notes (Signed)
TTS machine placed at bedside.   

## 2020-06-13 ENCOUNTER — Encounter (INDEPENDENT_AMBULATORY_CARE_PROVIDER_SITE_OTHER): Payer: Self-pay

## 2020-07-14 ENCOUNTER — Emergency Department (HOSPITAL_COMMUNITY): Payer: Medicare HMO

## 2020-07-14 ENCOUNTER — Other Ambulatory Visit: Payer: Self-pay

## 2020-07-14 ENCOUNTER — Emergency Department (HOSPITAL_COMMUNITY)
Admission: EM | Admit: 2020-07-14 | Discharge: 2020-07-14 | Disposition: A | Discharge status: Home / Self Care | Payer: Medicare HMO | Attending: Emergency Medicine | Admitting: Emergency Medicine

## 2020-07-14 ENCOUNTER — Encounter (HOSPITAL_COMMUNITY): Payer: Self-pay

## 2020-07-14 DIAGNOSIS — M6520 Calcific tendinitis, unspecified site: Secondary | ICD-10-CM

## 2020-07-14 DIAGNOSIS — E119 Type 2 diabetes mellitus without complications: Secondary | ICD-10-CM | POA: Diagnosis not present

## 2020-07-14 DIAGNOSIS — Z79899 Other long term (current) drug therapy: Secondary | ICD-10-CM | POA: Insufficient documentation

## 2020-07-14 DIAGNOSIS — M25512 Pain in left shoulder: Secondary | ICD-10-CM | POA: Diagnosis present

## 2020-07-14 DIAGNOSIS — M65222 Calcific tendinitis, left upper arm: Secondary | ICD-10-CM | POA: Insufficient documentation

## 2020-07-14 DIAGNOSIS — Z7984 Long term (current) use of oral hypoglycemic drugs: Secondary | ICD-10-CM | POA: Diagnosis not present

## 2020-07-14 MED ORDER — CYCLOBENZAPRINE HCL 10 MG PO TABS: 10.0000 mg | ORAL_TABLET | Freq: Two times a day (BID) | ORAL | 0 refills | Status: DC | PRN

## 2020-07-14 NOTE — Progress Notes (Signed)
Orthopedic Tech Progress Note Patient Details:  Stacy Orozco 1945/10/20 [id number]  Ortho Devices Type of Ortho Device: Sling and swathe Ortho Device/Splint Location: LUE Ortho Device/Splint Interventions: Ordered, Application   Post Interventions Patient Tolerated: Well Instructions Provided: Care of device  Maurene Capes 07/14/2020, 4:38 PM

## 2020-07-14 NOTE — Discharge Instructions (Addendum)
Please return for any problem.   Follow up with your regular Orthopedic provider on Monday.   Use alleve or motrin as instructed.

## 2020-07-14 NOTE — ED Provider Notes (Signed)
Emergency Medicine Provider Triage Evaluation Note  Stacy Orozco , a 75 y.o. female  was evaluated in triage.  Pt complains of gradual onset, constant, achy, diffuse left arm pain that began yesterday. PT reports some stiffness in her neck that is always there however slightly worse than normal. She is unsure if the pain is connected to her neck. She took 2 Aleve at 3:30 AM with mild relief. Pain is worse with movement. Denies any trauma. No chest pain or SOB. No weakness, numbness, tingling  Review of Systems  Positive: + left arm pain, left neck pain Negative: - chest pain, SOB  Physical Exam  BP (!) 136/59 (BP Location: Left Arm)   Pulse 66   Temp 98.2 F (36.8 C) (Oral)   Resp 16   SpO2 96%  Gen:   Awake, no distress   Resp:  Normal effort  MSK:   Moves extremities without difficulty  Other:  No swelling or erythema to LUE. + TTP to left shoulder and left humerus. ROM Intact. Strength 5/5 with grip strength. Sensation intact throughout.   Medical Decision Making  Medically screening exam initiated at 9:51 AM.  Appropriate orders placed.  DABRIA WADAS was informed that the remainder of the evaluation will be completed by another provider, this initial triage assessment does not replace that evaluation, and the importance of remaining in the ED until their evaluation is complete.     Tanda Rockers, PA-C 07/14/20 5400    Maia Plan, MD 07/15/20 [phone]

## 2020-07-14 NOTE — ED Provider Notes (Signed)
[facility] EMERGENCY DEPARTMENT Provider Note   CSN: [id number] Arrival date & time: 07/14/20  5732     History No chief complaint on file.   Stacy Orozco is a 75 y.o. female.  75 year old female with prior medical history as detailed below presents for evaluation of left shoulder and upper arm pain.  Patient reports onset of symptoms over the last 3 to 4 days.  She denies any specific inciting injury.  Pain is worse when she lies on her left shoulder.  Pain is also worse with lifting the left shoulder.  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  She denies any significant recent injury to the left shoulder.  She is right-handed.  She took Aleve at home with some improvement in her symptoms.  She denies associated chest pain or shortness of breath.  She has already established orthopedic care with [facility].  The history is provided by the patient and medical records.  Shoulder Pain Location:  Shoulder Shoulder location:  L shoulder Injury: no   Pain details:    Quality:  Aching   Radiates to:  L shoulder   Severity:  Mild   Onset quality:  Gradual   Duration:  4 days   Timing:  Intermittent   Progression:  Waxing and waning Handedness:  Right-handed Dislocation: no   Prior injury to area:  No Relieved by:  NSAIDs     Past Medical History:  Diagnosis Date   Depression    Diabetes mellitus without complication (HCC)    diet controlled   Hypercholesteremia    Hyperlipemia     Patient Active Problem List   Diagnosis Date Noted   Adjustment disorder with mixed emotional features 05/22/2015    Past Surgical History:  Procedure Laterality Date   TUBAL LIGATION       OB History   No obstetric history on file.     No family history on file.  Social History   Tobacco Use   Smoking status: Never  Substance Use Topics   Alcohol use: No   Drug use: No    Home Medications Prior to Admission medications    Medication Sig Start Date End Date Taking? Authorizing Provider  amitriptyline (ELAVIL) 10 MG tablet Take 10 mg by mouth at bedtime. 04/20/20  Yes [provider]  atorvastatin (LIPITOR) 20 MG tablet Take 20 mg by mouth daily.   Yes [provider]  calcium carbonate (OS-CAL) 600 MG TABS tablet Take 600 mg by mouth 2 (two) times daily with a meal.   Yes [provider]  citalopram (CELEXA) 20 MG tablet Take 1 tablet (20 mg total) by mouth daily. 05/22/15  Yes Alvira Monday, MD  glipiZIDE (GLUCOTROL XL) 5 MG 24 hr tablet Take 5 mg by mouth daily. 09/12/19  Yes [provider]  ibuprofen (ADVIL,MOTRIN) 200 MG tablet Take 200 mg by mouth every 6 (six) hours as needed for moderate pain.   Yes [provider]  losartan (COZAAR) 50 MG tablet Take 50 mg by mouth daily. 04/20/20  Yes [provider]  metFORMIN (GLUCOPHAGE-XR) 500 MG 24 hr tablet Take 500-1,000 mg by mouth 2 (two) times daily. 500 mg at supper & 1000 mg at bedtime 04/24/15  Yes [provider]  naproxen sodium (ALEVE) 220 MG tablet Take 220 mg by mouth daily as needed (pain).   Yes [provider]  vitamin E 400 UNIT capsule Take 400 Units by mouth  daily.   Yes [provider]    Allergies    Penicillins  Review of Systems   Review of Systems  All other systems reviewed and are negative.  Physical Exam Updated Vital Signs BP (!) 166/66   Pulse (!) 55   Temp 97.9 F (36.6 C)   Resp 18   SpO2 100%   Physical Exam Vitals and nursing note reviewed.  Constitutional:      General: She is not in acute distress.    Appearance: She is well-developed.  HENT:     Head: Normocephalic and atraumatic.  Eyes:     Conjunctiva/sclera: Conjunctivae normal.     Pupils: Pupils are equal, round, and reactive to light.  Cardiovascular:     Rate and Rhythm: Normal rate and regular rhythm.     Heart sounds: Normal heart sounds.  Pulmonary:     Effort:  Pulmonary effort is normal. No respiratory distress.     Breath sounds: Normal breath sounds.  Abdominal:     General: There is no distension.     Palpations: Abdomen is soft.     Tenderness: There is no abdominal tenderness.  Musculoskeletal:        General: Tenderness present. No deformity. Normal range of motion.     Cervical back: Normal range of motion and neck supple.     Comments: Mild diffuse tenderness to anterior lateral aspect of left shoulder   No erythema or edema   Distal LUE is NVI  Skin:    General: Skin is warm and dry.  Neurological:     Mental Status: She is alert and oriented to person, place, and time.    ED Results / Procedures / Treatments   Labs (all labs ordered are listed, but only abnormal results are displayed) Labs Reviewed - No data to display  EKG EKG Interpretation  Date/Time:  Saturday July 14 2020 09:51:07 EDT Ventricular Rate:  68 PR Interval:  170 QRS Duration: 90 QT Interval:  410 QTC Calculation: 435 R Axis:   53 Text Interpretation: Normal sinus rhythm Normal ECG Confirmed by Kristine Royal [phone]) on 07/14/2020 3:58:04 PM  Radiology DG Shoulder Left  Result Date: 07/14/2020 CLINICAL DATA:  Left shoulder pain.  No known injury EXAM: LEFT SHOULDER - 2+ VIEW COMPARISON:  None. FINDINGS: Normal alignment no fracture. AC joint normal. Shoulder joint normal Calcification in the soft tissues above the greater tuberosity compatible with calcific tendinitis in the rotator cuff. IMPRESSION: Calcific tendinitis Electronically Signed   By: Marlan Palau M.D.   On: 07/14/2020 10:46   DG Humerus Left  Result Date: 07/14/2020 CLINICAL DATA:  Left arm pain.  No known injury EXAM: LEFT HUMERUS - 2+ VIEW COMPARISON:  None. FINDINGS: Normal alignment and no fracture.  No bone lesion Soft tissue calcification in the rotator cuff compatible with calcific tendinitis. IMPRESSION: Calcific tendinitis rotator cuff Electronically Signed   By: Marlan Palau  M.D.   On: 07/14/2020 10:45    Procedures Procedures   Medications Ordered in ED Medications - No data to display  ED Course  I have reviewed the triage vital signs and the nursing notes.  Pertinent labs & imaging results that were available during my care of the patient were reviewed by me and considered in my medical decision making (see chart for details).    MDM Rules/Calculators/A&P  MDM  MSE complete  Stacy Orozco was evaluated in Emergency Department on 07/14/2020 for the symptoms described in the history of present illness. She was evaluated in the context of the global COVID-19 pandemic, which necessitated consideration that the patient might be at risk for infection with the SARS-CoV-2 virus that causes COVID-19. Institutional protocols and algorithms that pertain to the evaluation of patients at risk for COVID-19 are in a state of rapid change based on information released by regulatory bodies including the CDC and federal and state organizations. These policies and algorithms were followed during the patient's care in the ED.   Patient is presenting with complaint of left shoulder pain.  Patient is without historical suggestion of recent injury.  X-ray imaging obtained today demonstrates presence of calcific tendinitis.  Patient with likely inflammation related to same.  She has established care with [facility].  She understands need for use of anti-inflammatories over the next several days.  She understands need for close follow-up with her already established orthopedic provider.   Final Clinical Impression(s) / ED Diagnoses Final diagnoses:  Calcific tendinitis    Rx / DC Orders ED Discharge Orders     None        Wynetta Fines, MD 07/14/20 1626

## 2020-07-14 NOTE — ED Triage Notes (Signed)
Patient complains of left neck and pain to left arm since last night. Denies cp, no sob, denies trauma

## 2020-09-09 ENCOUNTER — Emergency Department (HOSPITAL_BASED_OUTPATIENT_CLINIC_OR_DEPARTMENT_OTHER)
Admission: EM | Admit: 2020-09-09 | Discharge: 2020-09-10 | Disposition: A | Discharge status: Home / Self Care | Payer: Medicare HMO | Attending: Emergency Medicine | Admitting: Emergency Medicine

## 2020-09-09 ENCOUNTER — Encounter (HOSPITAL_BASED_OUTPATIENT_CLINIC_OR_DEPARTMENT_OTHER): Payer: Self-pay

## 2020-09-09 ENCOUNTER — Other Ambulatory Visit: Payer: Self-pay

## 2020-09-09 DIAGNOSIS — L270 Generalized skin eruption due to drugs and medicaments taken internally: Secondary | ICD-10-CM | POA: Insufficient documentation

## 2020-09-09 DIAGNOSIS — E119 Type 2 diabetes mellitus without complications: Secondary | ICD-10-CM | POA: Insufficient documentation

## 2020-09-09 DIAGNOSIS — L299 Pruritus, unspecified: Secondary | ICD-10-CM | POA: Diagnosis present

## 2020-09-09 NOTE — ED Triage Notes (Signed)
Reports having itching and thinking she may have shingles.  But patient was started on antibiotic by dentist o1 week 3 days ago.

## 2020-09-09 NOTE — ED Provider Notes (Signed)
[facility]GENCY [facility]PT [facility] Emergency Department Provider Note MRN:  [id number]  Arrival date & time: 09/10/20     Chief Complaint   Pruritis   History of Present Illness   Stacy Orozco is a 75 y.o. year-old female with a history of hyperlipidemia, diabetes presenting to the ED with chief complaint of pruritus.  Patient is endorsing a painful and itchy rash to her torso and back, present for about 2 days.  Denies any shortness of breath, no nausea vomiting or diarrhea, no chest pain or shortness of breath, no fever, no other complaints.  Denies any new soaps or detergents.  Was started on clindamycin by a dentist about 1 week ago.  Has also been undergoing injections of her left eye, being treated for vein occlusion.  Review of Systems  A complete 10 system review of systems was obtained and all systems are negative except as noted in the HPI and PMH.   Patient's Health History    Past Medical History:  Diagnosis Date   Depression    Diabetes mellitus without complication (HCC)    diet controlled   Hypercholesteremia    Hyperlipemia     Past Surgical History:  Procedure Laterality Date   TUBAL LIGATION      No family history on file.  Social History   Socioeconomic History   Marital status: Married    Spouse name: Not on file   Number of children: Not on file   Years of education: Not on file   Highest education level: Not on file  Occupational History   Not on file  Tobacco Use   Smoking status: Never    Passive exposure: Never   Smokeless tobacco: Never  Substance and Sexual Activity   Alcohol use: No   Drug use: No   Sexual activity: Not on file  Other Topics Concern   Not on file  Social History Narrative   Not on file   Social Determinants of Health   Financial Resource Strain: Not on file  Food Insecurity: Not on file  Transportation Needs: Not on file  Physical Activity: Not on file  Stress: Not on file  Social Connections: Not on  file  Intimate Partner Violence: Not on file     Physical Exam   Vitals:   09/09/20 2155  BP: (!) 159/69  Pulse: 69  Resp: 18  Temp: 98.2 F (36.8 C)  SpO2: 98%    CONSTITUTIONAL: Well-appearing, NAD NEURO:  Alert and oriented x 3, no focal deficits EYES:  eyes equal and reactive ENT/NECK:  no LAD, no JVD CARDIO: Regular rate, well-perfused, normal S1 and S2 PULM:  CTAB no wheezing or rhonchi GI/GU:  normal bowel sounds, non-distended, non-tender MSK/SPINE:  No gross deformities, no edema SKIN: Diffuse macular blanching erythematous rash to the chest, abdomen, back PSYCH:  Appropriate speech and behavior  *Additional and/or pertinent findings included in MDM below  Diagnostic and Interventional Summary    EKG Interpretation  Date/Time:    Ventricular Rate:    PR Interval:    QRS Duration:   QT Interval:    QTC Calculation:   R Axis:     Text Interpretation:         Labs Reviewed  COMPREHENSIVE METABOLIC PANEL - Abnormal; Notable for the following components:      Result Value   Glucose, Bld 114 (*)    All other components within normal limits  CBC WITH DIFFERENTIAL/PLATELET    No orders to  display    Medications  predniSONE (DELTASONE) tablet 60 mg (has no administration in time range)  acetaminophen (TYLENOL) tablet 1,000 mg (1,000 mg Oral Given 09/10/20 0011)     Procedures  /  Critical Care Procedures  ED Course and Medical Decision Making  I have reviewed the triage vital signs, the nursing notes, and pertinent available records from the EMR.  Listed above are laboratory and imaging tests that I personally ordered, reviewed, and interpreted and then considered in my medical decision making (see below for details).  Suspect drug rash, also considering dress but no significant systemic symptoms.  Could also be allergic reaction.  We will check labs to ensure no organ involvement, will likely have patient stop the antibiotic and possibly provide  steroids for comfort or symptom management.       Elmer Sow. Pilar Plate, MD [facility] Emergency Medicine [facility] [email or web address]  Final Clinical Impressions(s) / ED Diagnoses     ICD-10-CM   1. Drug rash  L27.0       ED Discharge Orders          Ordered    predniSONE (DELTASONE) 20 MG tablet  Daily        09/10/20 0039             Discharge Instructions Discussed with and Provided to Patient:     Discharge Instructions      You were evaluated in the Emergency Department and after careful evaluation, we did not find any emergent condition requiring admission or further testing in the hospital.  Your exam/testing today was overall reassuring.  Symptoms seem to be due to a possible allergic response to the recent antibiotic medication.  We recommend stopping this medication.  Use the prednisone steroid medicine as prescribed.  Keep a close eye on your blood sugars as we discussed.  Please return to the Emergency Department if you experience any worsening of your condition.  Thank you for allowing [place] to be a part of your care.         Sabas Sous, MD 09/10/20 0040

## 2020-09-10 LAB — CBC WITH DIFFERENTIAL/PLATELET
Abs Immature Granulocytes: 0.03 10*3/uL (ref 0.00–0.07)
Basophils Absolute: 0 10*3/uL (ref 0.0–0.1)
Basophils Relative: 1 %
Eosinophils Absolute: 0.3 10*3/uL (ref 0.0–0.5)
Eosinophils Relative: 3 %
HCT: 37.6 % (ref 36.0–46.0)
Hemoglobin: 12.5 g/dL (ref 12.0–15.0)
Immature Granulocytes: 0 %
Lymphocytes Relative: 34 %
Lymphs Abs: 2.9 10*3/uL (ref 0.7–4.0)
MCH: 31.2 pg (ref 26.0–34.0)
MCHC: 33.2 g/dL (ref 30.0–36.0)
MCV: 93.8 fL (ref 80.0–100.0)
Monocytes Absolute: 0.7 10*3/uL (ref 0.1–1.0)
Monocytes Relative: 8 %
Neutro Abs: 4.7 10*3/uL (ref 1.7–7.7)
Neutrophils Relative %: 54 %
Platelets: 270 10*3/uL (ref 150–400)
RBC: 4.01 MIL/uL (ref 3.87–5.11)
RDW: 12.6 % (ref 11.5–15.5)
WBC: 8.6 10*3/uL (ref 4.0–10.5)
nRBC: 0 % (ref 0.0–0.2)

## 2020-09-10 LAB — COMPREHENSIVE METABOLIC PANEL
ALT: 12 U/L (ref 0–44)
AST: 17 U/L (ref 15–41)
Albumin: 4.3 g/dL (ref 3.5–5.0)
Alkaline Phosphatase: 77 U/L (ref 38–126)
Anion gap: 9 (ref 5–15)
BUN: 23 mg/dL (ref 8–23)
CO2: 24 mmol/L (ref 22–32)
Calcium: 9.7 mg/dL (ref 8.9–10.3)
Chloride: 103 mmol/L (ref 98–111)
Creatinine, Ser: 0.83 mg/dL (ref 0.44–1.00)
GFR, Estimated: >60 mL/min (ref >60)
Glucose, Bld: 114 mg/dL — ABNORMAL HIGH (ref 70–99)
Potassium: 4.5 mmol/L (ref 3.5–5.1)
Sodium: 136 mmol/L (ref 135–145)
Total Bilirubin: 0.4 mg/dL (ref 0.3–1.2)
Total Protein: 7.4 g/dL (ref 6.5–8.1)

## 2020-09-10 MED ORDER — PREDNISONE 20 MG PO TABS: 40.0000 mg | ORAL_TABLET | Freq: Every day | ORAL | 0 refills | Status: DC

## 2020-09-10 MED ORDER — PREDNISONE 20 MG PO TABS: 40.0000 mg | ORAL_TABLET | Freq: Every day | ORAL | 0 refills | Status: AC

## 2020-09-10 MED ORDER — ACETAMINOPHEN 500 MG PO TABS
1000.0000 mg | ORAL_TABLET | Freq: Once | ORAL | Status: AC
  Administered 2020-09-10: 1000 mg via ORAL
  Filled 2020-09-10: qty 2

## 2020-09-10 MED ORDER — PREDNISONE 50 MG PO TABS
60.0000 mg | ORAL_TABLET | Freq: Once | ORAL | Status: AC
  Administered 2020-09-10: 60 mg via ORAL
  Filled 2020-09-10: qty 1

## 2020-09-10 NOTE — Discharge Instructions (Addendum)
You were evaluated in the Emergency Department and after careful evaluation, we did not find any emergent condition requiring admission or further testing in the hospital.  Your exam/testing today was overall reassuring.  Symptoms seem to be due to a possible allergic response to the recent antibiotic medication.  We recommend stopping this medication.  Use the prednisone steroid medicine as prescribed.  Keep a close eye on your blood sugars as we discussed.  Please return to the Emergency Department if you experience any worsening of your condition.  Thank you for allowing [place] to be a part of your care.

## 2020-09-24 ENCOUNTER — Encounter (INDEPENDENT_AMBULATORY_CARE_PROVIDER_SITE_OTHER): Payer: Medicare HMO | Admitting: Ophthalmology

## 2020-09-24 ENCOUNTER — Encounter (INDEPENDENT_AMBULATORY_CARE_PROVIDER_SITE_OTHER): Payer: Self-pay | Admitting: Ophthalmology

## 2020-09-24 ENCOUNTER — Ambulatory Visit (INDEPENDENT_AMBULATORY_CARE_PROVIDER_SITE_OTHER): Payer: Medicare HMO | Admitting: Ophthalmology

## 2020-09-24 ENCOUNTER — Other Ambulatory Visit: Payer: Self-pay

## 2020-09-24 DIAGNOSIS — H34832 Tributary (branch) retinal vein occlusion, left eye, with macular edema: Secondary | ICD-10-CM | POA: Diagnosis not present

## 2020-09-24 DIAGNOSIS — H353132 Nonexudative age-related macular degeneration, bilateral, intermediate dry stage: Secondary | ICD-10-CM

## 2020-09-24 NOTE — Assessment & Plan Note (Signed)

## 2020-09-24 NOTE — Progress Notes (Signed)
09/24/2020     CHIEF COMPLAINT Patient presents for  Chief Complaint  Patient presents with   Retina Evaluation      HISTORY OF PRESENT ILLNESS: Stacy Orozco is a 75 y.o. female who presents to the clinic today for:   HPI     Retina Evaluation   In left eye.  This started 3 months ago.  Associated Symptoms Flashes and Floaters.  Negative for Distortion and Photophobia.  Treatments tried include no treatments.        Comments   NP- 2nd opinion Referred by Dr. [name]- Mellody Drown ARMD, BRVO "I went to Dr. Allyne Gee from Dr [name]. They told me I had macular edema and vein occlusion so he gave me a shot in my left eye, he used the generic medication. A couple months later I got the Eylea medication injection- the pain was awful compared to the first time. It was like excruciating pain. I went back and he said there was a scratch on my cornea, he said it would go away but it did not. Dr. Marchelle Gearing referred me. Tomorrow it will be 3 weeks since my last shot, I have had 3."  LBS: 150 this morning A1C: Pt states "7.2 or 3 last time but I go to the doctor this week for physical."      Last edited by Nelva Nay, COA on 09/24/2020  2:31 PM.      Referring physician: Krystal Clark, NP [address] [place],  [place] [address]  HISTORICAL INFORMATION:   Selected notes from the MEDICAL RECORD NUMBER       CURRENT MEDICATIONS: No current outpatient medications on file. (Ophthalmic Drugs)   No current facility-administered medications for this visit. (Ophthalmic Drugs)   Current Outpatient Medications (Other)  Medication Sig   amitriptyline (ELAVIL) 10 MG tablet Take 10 mg by mouth at bedtime.   atorvastatin (LIPITOR) 20 MG tablet Take 20 mg by mouth daily.   calcium carbonate (OS-CAL) 600 MG TABS tablet Take 600 mg by mouth 2 (two) times daily with a meal.   citalopram (CELEXA) 20 MG tablet Take 1 tablet (20 mg total) by mouth daily.   cyclobenzaprine (FLEXERIL)  10 MG tablet Take 1 tablet (10 mg total) by mouth 2 (two) times daily as needed for muscle spasms.   glipiZIDE (GLUCOTROL XL) 5 MG 24 hr tablet Take 5 mg by mouth daily.   ibuprofen (ADVIL,MOTRIN) 200 MG tablet Take 200 mg by mouth every 6 (six) hours as needed for moderate pain.   losartan (COZAAR) 50 MG tablet Take 50 mg by mouth daily.   metFORMIN (GLUCOPHAGE-XR) 500 MG 24 hr tablet Take 500-1,000 mg by mouth 2 (two) times daily. 500 mg at supper & 1000 mg at bedtime   naproxen sodium (ALEVE) 220 MG tablet Take 220 mg by mouth daily as needed (pain).   vitamin E 400 UNIT capsule Take 400 Units by mouth daily.   No current facility-administered medications for this visit. (Other)      REVIEW OF SYSTEMS:    ALLERGIES Allergies  Allergen Reactions   Penicillins Rash    Has patient had a PCN reaction causing immediate rash, facial/tongue/throat swelling, SOB or lightheadedness with hypotension:  No (pt did experience minor rash)  Has patient had a PCN reaction causing severe rash involving mucus membranes or skin necrosis: no Has patient had a PCN reaction that required hospitalization: no Has patient had a PCN reaction occurring within the last  10 years: no If all of the above answers are "NO", then may proceed with Cephalosporin use.     PAST MEDICAL HISTORY Past Medical History:  Diagnosis Date   Depression    Diabetes mellitus without complication (HCC)    diet controlled   Hypercholesteremia    Hyperlipemia    Past Surgical History:  Procedure Laterality Date   TUBAL LIGATION      FAMILY HISTORY History reviewed. No pertinent family history.  SOCIAL HISTORY Social History   Tobacco Use   Smoking status: Never    Passive exposure: Never   Smokeless tobacco: Never  Substance Use Topics   Alcohol use: No   Drug use: No         OPHTHALMIC EXAM:  Base Eye Exam     Visual Acuity (ETDRS)       Right Left   Dist [place] 20/50 -1 20/30   Dist ph [place] 20/30 +2           Tonometry (Tonopen, 2:36 PM)       Right Left   Pressure 15 15         Pupils       Pupils Dark Light Shape React APD   Right PERRL 4 3 Round Brisk None   Left PERRL 4 3 Round Brisk None         Visual Fields (Counting fingers)       Left Right    Full Full         Extraocular Movement       Right Left    Full Full         Neuro/Psych     Oriented x3: Yes   Mood/Affect: Normal         Dilation     Both eyes: 1.0% Mydriacyl, 2.5% Phenylephrine @ 2:36 PM           Slit Lamp and Fundus Exam     External Exam       Right Left   External Normal Normal         Slit Lamp Exam       Right Left   Lids/Lashes Normal Normal   Conjunctiva/Sclera White and quiet White and quiet   Cornea Clear Clear   Anterior Chamber Deep and quiet Deep and quiet   Iris Round and reactive Round and reactive   Lens 2.5+ Nuclear sclerosis 2.5+ Nuclear sclerosis   Anterior Vitreous Normal Normal         Fundus Exam       Right Left   Posterior Vitreous Normal Normal   Disc Normal Normal   C/D Ratio 0.2 0.2   Macula Normal Microaneurysms, Intraretinal hemorrhage macula, inferiorly   Vessels Normal Normal   Periphery Normal Normal            IMAGING AND PROCEDURES  Imaging and Procedures for 09/24/20  OCT, Retina - OU - Both Eyes       Right Eye Quality was good. Central Foveal Thickness: 296. Findings include retinal drusen .   Left Eye Quality was good. Scan locations included subfoveal. Central Foveal Thickness: 297. Progression has no prior data. Findings include retinal drusen .   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  No macular edema today some 3 weeks after most recent injection antivegF, Eylea     Color Fundus Photography Optos - OU - Both Eyes  Right Eye Disc findings include normal observations. Macula : drusen. Vessels : normal observations.  Periphery : normal observations.   Left Eye Progression has no prior data. Disc findings include normal observations. Macula : drusen, microaneurysms. Periphery : normal observations.   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 Checking an Amsler Grid daily with advice to return immediately should a distortion develop, was given to the patient. The patient 's smoking status now and in the past was determined and advice based on the AREDS study was provided regarding the consumption of antioxidant supplements. AREDS 2 vitamin formulation was recommended. Consumption of dark leafy vegetables and fresh fruits of various colors was recommended. Treatment modalities for wet macular degeneration particularly the use of intravitreal injections of anti-blood vessel growth factors was discussed with the patient. Avastin, Lucentis, and Eylea are the available options. On occasion, therapy includes the use of photodynamic therapy and thermal laser. Stressed to the patient do not rub eyes.  Patient was advised to check Amsler Grid daily and return immediately if changes are noted. Instructions on using the grid were given to the patient. All patient questions were answered.      ICD-10-CM   1. 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 Intermediate stage nonexudative age-related macular degeneration of both eyes  H35.3132       OS with a macular branch retinal vein occlusion.  I do agree with the use of antivegF to control the macular edema in  this consideration.  There is no sign of wet AMD OS.  2.  Patient shared a lengthy discussion about the techniques that we use to deliver the antivegF medication so as to control the macular edema and preserve visual acuity and functioning.  I assured her that the techniques we have evolved to have diminished the chance of the scratched eye type sensation that can develop in anyone requiring topical numbing medicine (anesthetic) in order to deliver occasions into the eye to protect vision from being lost.  I reviewed in detail the measures we do that are somewhat different than others.  3.  We will schedule patient for follow-up should she choose to transfer her care here.  The less I have assured her that the macular findings in the left eye have resolved probably responded to the medications as there is currently no active CME at 3 weeks.  She is scheduled for follow-up currently on 3 more weeks.  Ophthalmic Meds Ordered this visit:  No orders of the defined types were placed in this encounter.      Return in about 3 weeks (around 10/15/2020) for dilate, OS, AVASTIN OCT.  There are no Patient Instructions on file for this visit.   Explained the diagnoses, plan, and follow up with the patient and they expressed understanding.  Patient expressed understanding of the importance of proper follow up care.   Alford Highland Aftan Vint M.D. Diseases & Surgery of the Retina and Vitreous [facility] 09/24/20  Abbreviations: M myopia (nearsighted); A astigmatism; H hyperopia (farsighted); P presbyopia; Mrx spectacle prescription;  CTL contact lenses; OD right eye; OS left eye; OU both eyes  XT exotropia; ET esotropia; PEK punctate epithelial keratitis; PEE punctate epithelial erosions; DES dry eye syndrome; MGD meibomian gland dysfunction; ATs artificial tears; PFAT's preservative free artificial tears; NSC nuclear sclerotic cataract; PSC posterior subcapsular cataract; ERM epi-retinal  membrane; PVD posterior vitreous detachment; RD retinal detachment; DM diabetes mellitus; DR diabetic retinopathy; NPDR non-proliferative diabetic retinopathy; PDR proliferative diabetic retinopathy; CSME clinically significant macular edema; DME diabetic macular edema; dbh dot blot hemorrhages; CWS cotton wool spot; POAG primary open angle glaucoma; C/D cup-to-disc ratio; HVF humphrey visual field; GVF goldmann visual field; OCT optical coherence tomography; IOP intraocular pressure; BRVO Branch retinal vein occlusion; CRVO central retinal vein occlusion; CRAO central retinal artery occlusion; BRAO branch retinal artery occlusion; RT retinal tear; SB scleral buckle; PPV pars plana vitrectomy; VH Vitreous hemorrhage; PRP panretinal laser photocoagulation; IVK intravitreal kenalog; VMT vitreomacular traction; MH Macular hole;  NVD neovascularization of the disc; NVE neovascularization elsewhere; AREDS age related eye disease study; ARMD age related macular degeneration; POAG primary open angle glaucoma; EBMD epithelial/anterior basement membrane dystrophy; ACIOL anterior chamber intraocular lens; IOL intraocular lens; PCIOL posterior chamber intraocular lens; Phaco/IOL phacoemulsification with intraocular lens placement; PRK photorefractive keratectomy; LASIK laser assisted in situ keratomileusis; HTN hypertension; DM diabetes mellitus; COPD chronic obstructive pulmonary disease

## 2020-10-15 ENCOUNTER — Encounter (INDEPENDENT_AMBULATORY_CARE_PROVIDER_SITE_OTHER): Payer: Medicare HMO | Admitting: Ophthalmology

## 2020-10-22 ENCOUNTER — Other Ambulatory Visit: Payer: Self-pay

## 2020-10-22 ENCOUNTER — Encounter (INDEPENDENT_AMBULATORY_CARE_PROVIDER_SITE_OTHER): Payer: Self-pay | Admitting: Ophthalmology

## 2020-10-22 ENCOUNTER — Ambulatory Visit (INDEPENDENT_AMBULATORY_CARE_PROVIDER_SITE_OTHER): Payer: Medicare HMO | Admitting: Ophthalmology

## 2020-10-22 DIAGNOSIS — H34832 Tributary (branch) retinal vein occlusion, left eye, with macular edema: Secondary | ICD-10-CM | POA: Diagnosis not present

## 2020-10-22 MED ORDER — BEVACIZUMAB 2.5 MG/0.1ML IZ SOSY
2.5000 mg | PREFILLED_SYRINGE | INTRAVITREAL | Status: AC | PRN
  Administered 2020-10-22: 2.5 mg via INTRAVITREAL

## 2020-10-22 NOTE — Progress Notes (Signed)
10/22/2020     CHIEF COMPLAINT Patient presents for  Chief Complaint  Patient presents with   Retina Follow Up      HISTORY OF PRESENT ILLNESS: Stacy Orozco is a 75 y.o. female who presents to the clinic today for:   HPI     Retina Follow Up   Patient presents with  CRVO/BRVO.  In left eye.  This started 4 weeks ago.  Duration of 4 weeks.        Comments   4 week f/u OS with OCT and possible Avastin injection  Pt states she responded well with her very first injection, however with her 2nd and 3rd injection caused significant surface irritation and FBS along with eye pain. Pt c/o that she is getting other systemic reactions due to the stress and anxiety of her recent reactions. Pt denies any visual changes since previous visit. Pt denies any new flashes or floaters. Pt denies any eye pain.   Type 2 Diabetic. 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      Referring physician: Krystal Clark, NP [address] [place],  [place] [address]  HISTORICAL INFORMATION:   Selected notes from the MEDICAL RECORD NUMBER       CURRENT MEDICATIONS: No current outpatient medications on file. (Ophthalmic Drugs)   No current facility-administered medications for this visit. (Ophthalmic Drugs)   Current Outpatient Medications (Other)  Medication Sig   amitriptyline (ELAVIL) 10 MG tablet Take 10 mg by mouth at bedtime.   atorvastatin (LIPITOR) 20 MG tablet Take 20 mg by mouth daily.   calcium carbonate (OS-CAL) 600 MG TABS tablet Take 600 mg by mouth 2 (two) times daily with a meal.   citalopram (CELEXA) 20 MG tablet Take 1 tablet (20 mg total) by mouth daily.   cyclobenzaprine (FLEXERIL) 10 MG tablet Take 1 tablet (10 mg total) by mouth 2 (two) times daily as needed for muscle spasms.   glipiZIDE (GLUCOTROL XL) 5 MG 24 hr tablet Take 5 mg by mouth daily.   ibuprofen (ADVIL,MOTRIN) 200 MG  tablet Take 200 mg by mouth every 6 (six) hours as needed for moderate pain.   losartan (COZAAR) 50 MG tablet Take 50 mg by mouth daily.   metFORMIN (GLUCOPHAGE-XR) 500 MG 24 hr tablet Take 500-1,000 mg by mouth 2 (two) times daily. 500 mg at supper & 1000 mg at bedtime   naproxen sodium (ALEVE) 220 MG tablet Take 220 mg by mouth daily as needed (pain).   vitamin E 400 UNIT capsule Take 400 Units by mouth daily.   No current facility-administered medications for this visit. (Other)      REVIEW OF SYSTEMS:    ALLERGIES Allergies  Allergen Reactions   Penicillins Rash    Has patient had a PCN reaction causing immediate rash, facial/tongue/throat swelling, SOB or lightheadedness with hypotension:  No (pt did experience minor rash)  Has patient had a PCN reaction causing severe rash involving mucus membranes or skin necrosis: no Has patient had a PCN reaction that required hospitalization: no Has patient had a PCN reaction occurring within the last 10 years: no If all of the above answers are "NO", then may proceed with Cephalosporin use.     PAST MEDICAL HISTORY Past Medical History:  Diagnosis Date   Depression    Diabetes mellitus without complication (HCC)  diet controlled   Hypercholesteremia    Hyperlipemia    Past Surgical History:  Procedure Laterality Date   TUBAL LIGATION      FAMILY HISTORY History reviewed. No pertinent family history.  SOCIAL HISTORY Social History   Tobacco Use   Smoking status: Never    Passive exposure: Never   Smokeless tobacco: Never  Substance Use Topics   Alcohol use: No   Drug use: No         OPHTHALMIC EXAM:  Base Eye Exam     Visual Acuity (ETDRS)       Right Left   Dist [place] 20/60 -1 20/60 -2   Dist ph [place] 20/50 +2 20/30 -2         Pupils       Pupils Dark Light Shape React APD   Right PERRL 4 3 Round Brisk None   Left PERRL 4 3 Round Brisk None         Visual Fields (Counting fingers)       Left  Right    Full Full         Extraocular Movement       Right Left    Full, Ortho Full, Ortho         Neuro/Psych     Oriented x3: Yes   Mood/Affect: Normal         Dilation     Left eye: 1.0% Mydriacyl, 2.5% Phenylephrine @ 4:26 PM           Slit Lamp and Fundus Exam     External Exam       Right Left   External Normal Normal         Slit Lamp Exam       Right Left   Lids/Lashes Normal Normal   Conjunctiva/Sclera White and quiet White and quiet   Cornea Clear Clear   Anterior Chamber Deep and quiet Deep and quiet   Iris Round and reactive Round and reactive   Lens 2.5+ Nuclear sclerosis 2.5+ Nuclear sclerosis   Anterior Vitreous Normal Normal         Fundus Exam       Right Left   Posterior Vitreous Normal Normal   Disc Normal Normal   C/D Ratio 0.2 0.2   Macula Normal Microaneurysms, Intraretinal hemorrhage macula, inferiorly   Vessels Normal Normal   Periphery Normal Normal            IMAGING AND PROCEDURES  Imaging and Procedures for 10/22/20  OCT, Retina - OU - Both Eyes       Right Eye Quality was good. Central Foveal Thickness: 296. Progression has been stable. Findings include retinal drusen , normal foveal contour.   Left Eye Quality was good. Scan locations included subfoveal. Central Foveal Thickness: 297. Progression has no prior data. Findings include retinal drusen .   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  There was still with less edema than prior.  And also yet clinically with intraretinal hemorrhages inferior to the FAZ.  This may begin to become lateralizing left eye.     Intravitreal Injection, Pharmacologic Agent - OS - Left Eye       Time Out 10/22/2020. 5:01 PM. Confirmed correct patient, procedure, site, and patient consented.   Anesthesia Topical anesthesia was used. Anesthetic medications included Akten 3.5%.    Procedure Preparation included 10% betadine to eyelids, 5% betadine to  ocular surface. A 30 gauge needle was used.   Injection: 2.5 mg bevacizumab 2.5 MG/0.1ML   Route: Intravitreal, Site: Left Eye   NDC: [id number], Lot: [id number]   Post-op Post injection exam found visual acuity of at least counting fingers. The patient tolerated the procedure well. There were no complications. The patient received written and verbal post procedure care education. Post injection medications included ocuflox.              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  No CME today, will recommence therapy with Avastin but extend interval examination next 8 weeks.  First injection this office first time using topical Akten as compared to subconjunctival injections for anesthetic  Patient encouraged to use artificial tears after the injection is performed today until bedtime     ICD-10-CM   1. 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  OS vastly improved and stable yet likely the beginning of collateralization of this macular branch retinal vein occlusion.  Repeat injection today and extend interval examination to maintain this collateralization process and stability.  2.  Patient struck to use artificial tears should symptoms of scratchy irritation or burning develop after departure today  3.  Ophthalmic Meds Ordered this visit:  Meds ordered this encounter  Medications   bevacizumab (AVASTIN) SOSY 2.5 mg       Return in about 8 weeks (around 12/17/2020) for dilate, OS, AVASTIN OCT possible.  There are no Patient Instructions on file for this visit.   Explained the diagnoses, plan, and follow up with the  patient and they expressed understanding.  Patient expressed understanding of the importance of proper follow up care.   Alford Highland Kiaria Quinnell M.D. Diseases & Surgery of the Retina and Vitreous [facility] 10/22/20     Abbreviations: M myopia (nearsighted); A astigmatism; H hyperopia (farsighted); P presbyopia; Mrx spectacle prescription;  CTL contact lenses; OD right eye; OS left eye; OU both eyes  XT exotropia; ET esotropia; PEK punctate epithelial keratitis; PEE punctate epithelial erosions; DES dry eye syndrome; MGD meibomian gland dysfunction; ATs artificial tears; PFAT's preservative free artificial tears; NSC nuclear sclerotic cataract; PSC posterior subcapsular cataract; ERM epi-retinal membrane; PVD posterior vitreous detachment; RD retinal detachment; DM diabetes mellitus; DR diabetic retinopathy; NPDR non-proliferative diabetic retinopathy; PDR proliferative diabetic retinopathy; CSME clinically significant macular edema; DME diabetic macular edema; dbh dot blot hemorrhages; CWS cotton wool spot; POAG primary open angle glaucoma; C/D cup-to-disc ratio; HVF humphrey visual field; GVF goldmann visual field; OCT optical coherence tomography; IOP intraocular pressure; BRVO Branch retinal vein occlusion; CRVO central retinal vein occlusion; CRAO central retinal artery occlusion; BRAO branch retinal artery occlusion; RT retinal tear; SB scleral buckle; PPV pars plana vitrectomy; VH Vitreous hemorrhage; PRP panretinal laser photocoagulation; IVK intravitreal kenalog; VMT vitreomacular traction; MH Macular hole;  NVD neovascularization of the disc; NVE neovascularization elsewhere; AREDS age related eye disease study; ARMD age related macular degeneration; POAG primary open angle glaucoma; EBMD epithelial/anterior basement membrane dystrophy; ACIOL anterior chamber intraocular lens; IOL intraocular lens; PCIOL posterior chamber intraocular lens; Phaco/IOL phacoemulsification with intraocular  lens placement; PRK photorefractive keratectomy; LASIK laser assisted in situ keratomileusis; HTN hypertension; DM  diabetes mellitus; COPD chronic obstructive pulmonary disease

## 2020-10-22 NOTE — Assessment & Plan Note (Addendum)
Branch retinal vein occlusion along the inferotemporal aspect of the macula.  No CME today, will recommence therapy with Avastin but extend interval examination next 8 weeks.  First injection this office first time using topical Akten as compared to subconjunctival injections for anesthetic  Patient encouraged to use artificial tears after the injection is performed today until bedtime

## 2020-12-17 ENCOUNTER — Other Ambulatory Visit: Payer: Self-pay

## 2020-12-17 ENCOUNTER — Ambulatory Visit (INDEPENDENT_AMBULATORY_CARE_PROVIDER_SITE_OTHER): Payer: Medicare HMO | Admitting: Ophthalmology

## 2020-12-17 ENCOUNTER — Encounter (INDEPENDENT_AMBULATORY_CARE_PROVIDER_SITE_OTHER): Payer: Self-pay | Admitting: Ophthalmology

## 2020-12-17 DIAGNOSIS — H34832 Tributary (branch) retinal vein occlusion, left eye, with macular edema: Secondary | ICD-10-CM

## 2020-12-17 MED ORDER — BEVACIZUMAB 2.5 MG/0.1ML IZ SOSY
2.5000 mg | PREFILLED_SYRINGE | INTRAVITREAL | Status: AC | PRN
  Administered 2020-12-17: 2.5 mg via INTRAVITREAL

## 2020-12-17 NOTE — Progress Notes (Signed)
12/17/2020     CHIEF COMPLAINT Patient presents for  Chief Complaint  Patient presents with   Retina Follow Up      HISTORY OF PRESENT ILLNESS: Stacy Orozco is a 74 y.o. female who presents to the clinic today for:   HPI     Retina Follow Up   Patient presents with  CRVO/BRVO.  In left eye.  This started 8 weeks ago.  Duration of 8 weeks.  Since onset it is stable.        Comments   8 week f/u OS with OCT and possible Avastin injection OS.        Last edited by Frederik Pear, COA on 12/17/2020  3:04 PM.      Referring physician: Sallye Lat, MD [address] [place],  [place] [address]  HISTORICAL INFORMATION:   Selected notes from the MEDICAL RECORD NUMBER       CURRENT MEDICATIONS: No current outpatient medications on file. (Ophthalmic Drugs)   No current facility-administered medications for this visit. (Ophthalmic Drugs)   Current Outpatient Medications (Other)  Medication Sig   amitriptyline (ELAVIL) 10 MG tablet Take 10 mg by mouth at bedtime.   atorvastatin (LIPITOR) 20 MG tablet Take 20 mg by mouth daily.   calcium carbonate (OS-CAL) 600 MG TABS tablet Take 600 mg by mouth 2 (two) times daily with a meal.   citalopram (CELEXA) 20 MG tablet Take 1 tablet (20 mg total) by mouth daily.   cyclobenzaprine (FLEXERIL) 10 MG tablet Take 1 tablet (10 mg total) by mouth 2 (two) times daily as needed for muscle spasms.   glipiZIDE (GLUCOTROL XL) 5 MG 24 hr tablet Take 5 mg by mouth daily.   ibuprofen (ADVIL,MOTRIN) 200 MG tablet Take 200 mg by mouth every 6 (six) hours as needed for moderate pain.   losartan (COZAAR) 50 MG tablet Take 50 mg by mouth daily.   metFORMIN (GLUCOPHAGE-XR) 500 MG 24 hr tablet Take 500-1,000 mg by mouth 2 (two) times daily. 500 mg at supper & 1000 mg at bedtime   naproxen sodium (ALEVE) 220 MG tablet Take 220 mg by mouth daily as needed (pain).   vitamin E 400 UNIT capsule Take 400 Units by mouth daily.   No  current facility-administered medications for this visit. (Other)      REVIEW OF SYSTEMS:    ALLERGIES Allergies  Allergen Reactions   Penicillins Rash    Has patient had a PCN reaction causing immediate rash, facial/tongue/throat swelling, SOB or lightheadedness with hypotension:  No (pt did experience minor rash)  Has patient had a PCN reaction causing severe rash involving mucus membranes or skin necrosis: no Has patient had a PCN reaction that required hospitalization: no Has patient had a PCN reaction occurring within the last 10 years: no If all of the above answers are "NO", then may proceed with Cephalosporin use.     PAST MEDICAL HISTORY Past Medical History:  Diagnosis Date   Depression    Diabetes mellitus without complication (HCC)    diet controlled   Hypercholesteremia    Hyperlipemia    Past Surgical History:  Procedure Laterality Date   TUBAL LIGATION      FAMILY HISTORY History reviewed. No pertinent family history.  SOCIAL HISTORY Social History   Tobacco Use   Smoking status: Never    Passive exposure: Never   Smokeless tobacco: Never  Substance Use Topics   Alcohol use: No   Drug  use: No         OPHTHALMIC EXAM:  Base Eye Exam     Visual Acuity (ETDRS)       Right Left   Dist [place] 20/80 +2 20/50   Dist ph [place] 20/25 -1 20/30         Tonometry (Tonopen, 3:11 PM)       Right Left   Pressure 17 16         Pupils       Pupils Dark Light Shape React APD   Right PERRL 4 3 Round Brisk None   Left PERRL 4 3 Round Brisk None         Visual Fields (Counting fingers)       Left Right    Full Full         Extraocular Movement       Right Left    Full, Ortho Full, Ortho         Neuro/Psych     Oriented x3: Yes   Mood/Affect: Normal         Dilation     Left eye: 1.0% Mydriacyl, 2.5% Phenylephrine @ 3:11 PM           Slit Lamp and Fundus Exam     External Exam       Right Left   External Normal  Normal         Slit Lamp Exam       Right Left   Lids/Lashes Normal Normal   Conjunctiva/Sclera White and quiet White and quiet   Cornea Clear Clear   Anterior Chamber Deep and quiet Deep and quiet   Iris Round and reactive Round and reactive   Lens 2.5+ Nuclear sclerosis 2.5+ Nuclear sclerosis   Anterior Vitreous Normal Normal         Fundus Exam       Right Left   Posterior Vitreous  Normal   Disc  Normal   C/D Ratio 0.2 0.2   Macula  Microaneurysms, Intraretinal hemorrhage macula, inferiorly   Vessels  Normal   Periphery  Normal            IMAGING AND PROCEDURES  Imaging and Procedures for 12/17/20  OCT, Retina - OU - Both Eyes       Right Eye Quality was good. Central Foveal Thickness: 296. Progression has been stable. Findings include retinal drusen , normal foveal contour.   Left Eye Quality was good. Scan locations included subfoveal. Central Foveal Thickness: 297. Progression has worsened. Findings include retinal drusen , cystoid macular edema.   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  OS with worse CME at 8-week follow-up interval today.  Will need repeat injection Avastin today and follow-up next in 6-week     Intravitreal Injection, Pharmacologic Agent - OS - Left Eye       Time Out 12/17/2020. 3:54 PM. Confirmed correct patient, procedure, site, and patient consented.   Anesthesia Topical anesthesia was used. Anesthetic medications included Akten 3.5%.   Procedure Preparation included 10% betadine to eyelids, 5% betadine to ocular surface, Tobramycin 0.3%. A 30 gauge needle was used.   Injection: 2.5 mg bevacizumab 2.5 MG/0.1ML   Route: Intravitreal, Site: Left Eye   NDC: [phone], Lot: [id number]   Post-op Post injection exam found visual acuity of at least counting fingers. The patient tolerated the procedure well. There were no complications. The patient  received written and verbal post procedure care education. Post injection medications included ocuflox.              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  We will need to repeat injection today and shorten interval examination next to 6 weeks     ICD-10-CM   1. 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  OS with recurrent CME at 8-week follow-up interval.  We will need to repeat injection Avastin today follow-up next in shorter interval of 6 weeks  2.  3.  Ophthalmic Meds Ordered this visit:  Meds ordered this encounter  Medications   bevacizumab (AVASTIN) SOSY 2.5 mg       Return in about 6 weeks (around 01/28/2021) for dilate, OS, AVASTIN OCT.  There are no Patient Instructions on file for this visit.   Explained the diagnoses, plan, and follow up with the patient and they expressed understanding.  Patient expressed understanding of the importance of proper follow up care.   Alford Highland Chadley Dziedzic M.D. Diseases & Surgery of the Retina and Vitreous [facility] 12/17/20     Abbreviations: M myopia (nearsighted); A astigmatism; H hyperopia (farsighted); P presbyopia; Mrx spectacle prescription;  CTL contact lenses; OD right eye; OS left eye; OU both eyes  XT exotropia; ET esotropia; PEK punctate epithelial keratitis; PEE punctate epithelial erosions; DES dry eye syndrome; MGD meibomian gland dysfunction; ATs artificial tears; PFAT's preservative free artificial tears; NSC nuclear sclerotic cataract; PSC posterior subcapsular cataract; ERM epi-retinal membrane; PVD posterior vitreous detachment; RD retinal detachment; DM diabetes mellitus; DR diabetic  retinopathy; NPDR non-proliferative diabetic retinopathy; PDR proliferative diabetic retinopathy; CSME clinically significant macular edema; DME diabetic macular edema; dbh dot blot hemorrhages; CWS cotton wool spot; POAG primary open angle glaucoma; C/D cup-to-disc ratio; HVF humphrey visual field; GVF goldmann visual field; OCT optical coherence tomography; IOP intraocular pressure; BRVO Branch retinal vein occlusion; CRVO central retinal vein occlusion; CRAO central retinal artery occlusion; BRAO branch retinal artery occlusion; RT retinal tear; SB scleral buckle; PPV pars plana vitrectomy; VH Vitreous hemorrhage; PRP panretinal laser photocoagulation; IVK intravitreal kenalog; VMT vitreomacular traction; MH Macular hole;  NVD neovascularization of the disc; NVE neovascularization elsewhere; AREDS age related eye disease study; ARMD age related macular degeneration; POAG primary open angle glaucoma; EBMD epithelial/anterior basement membrane dystrophy; ACIOL anterior chamber intraocular lens; IOL intraocular lens; PCIOL posterior chamber intraocular lens; Phaco/IOL phacoemulsification with intraocular lens placement; PRK photorefractive keratectomy; LASIK laser assisted in situ keratomileusis; HTN hypertension; DM diabetes mellitus; COPD chronic obstructive pulmonary disease

## 2020-12-17 NOTE — Assessment & Plan Note (Signed)
BRVO with CME, worse in this visit at 8-week interval post extension of interval of therapy and evaluation.  We will need to repeat injection today and shorten interval examination next to 6 weeks

## 2021-01-29 ENCOUNTER — Encounter (INDEPENDENT_AMBULATORY_CARE_PROVIDER_SITE_OTHER): Payer: Medicare HMO | Admitting: Ophthalmology

## 2021-02-07 ENCOUNTER — Emergency Department (HOSPITAL_COMMUNITY)
Admission: EM | Admit: 2021-02-07 | Discharge: 2021-02-08 | Disposition: A | Discharge status: Home / Self Care | Payer: Medicare HMO | Attending: Emergency Medicine | Admitting: Emergency Medicine

## 2021-02-07 ENCOUNTER — Other Ambulatory Visit: Payer: Self-pay

## 2021-02-07 ENCOUNTER — Encounter (HOSPITAL_COMMUNITY): Payer: Self-pay | Admitting: Emergency Medicine

## 2021-02-07 ENCOUNTER — Emergency Department (HOSPITAL_COMMUNITY): Payer: Medicare HMO

## 2021-02-07 DIAGNOSIS — Z79899 Other long term (current) drug therapy: Secondary | ICD-10-CM | POA: Insufficient documentation

## 2021-02-07 DIAGNOSIS — I10 Essential (primary) hypertension: Secondary | ICD-10-CM | POA: Diagnosis not present

## 2021-02-07 DIAGNOSIS — R0789 Other chest pain: Secondary | ICD-10-CM | POA: Diagnosis present

## 2021-02-07 DIAGNOSIS — R072 Precordial pain: Secondary | ICD-10-CM

## 2021-02-07 DIAGNOSIS — Z8616 Personal history of COVID-19: Secondary | ICD-10-CM | POA: Insufficient documentation

## 2021-02-07 LAB — TROPONIN I (HIGH SENSITIVITY)
Troponin I (High Sensitivity): 4 ng/L (ref <18)
Troponin I (High Sensitivity): 4 ng/L (ref <18)

## 2021-02-07 LAB — CBC
HCT: 34.6 % — ABNORMAL LOW (ref 36.0–46.0)
Hemoglobin: 11.2 g/dL — ABNORMAL LOW (ref 12.0–15.0)
MCH: 30.6 pg (ref 26.0–34.0)
MCHC: 32.4 g/dL (ref 30.0–36.0)
MCV: 94.5 fL (ref 80.0–100.0)
Platelets: 282 10*3/uL (ref 150–400)
RBC: 3.66 MIL/uL — ABNORMAL LOW (ref 3.87–5.11)
RDW: 12.4 % (ref 11.5–15.5)
WBC: 6.8 10*3/uL (ref 4.0–10.5)
nRBC: 0 % (ref 0.0–0.2)

## 2021-02-07 LAB — BASIC METABOLIC PANEL
Anion gap: 10 (ref 5–15)
BUN: 15 mg/dL (ref 8–23)
CO2: 24 mmol/L (ref 22–32)
Calcium: 9.8 mg/dL (ref 8.9–10.3)
Chloride: 105 mmol/L (ref 98–111)
Creatinine, Ser: 0.75 mg/dL (ref 0.44–1.00)
GFR, Estimated: >60 mL/min (ref >60)
Glucose, Bld: 204 mg/dL — ABNORMAL HIGH (ref 70–99)
Potassium: 4.3 mmol/L (ref 3.5–5.1)
Sodium: 139 mmol/L (ref 135–145)

## 2021-02-07 NOTE — ED Provider Triage Note (Signed)
Emergency Medicine Provider Triage Evaluation Note  Stacy Orozco , a 76 y.o. female  was evaluated in triage.  Pt complains of chest pain.  Chest pain started yesterday.  Is in the middle of her chest.  It has been constant.  Feels like pressure.  She also has pain in the back of her neck and bilateral shoulders.  This is also been present since yesterday.  She is not having any shortness of breath.  She does endorse having some nausea but no vomiting.  Review of Systems  Positive: Chest pain, nausea, shoulder pain Negative:   Physical Exam  BP (!) 183/68 (BP Location: Left Arm)    Pulse 64    Temp 98.5 F (36.9 C) (Oral)    Resp 16    SpO2 100%  Gen:   Awake, no distress   Resp:  Normal effort  MSK:   Moves extremities without difficulty  Other:  Pulses equal bilaterally in all ext. Heart with no murmur. RRR. Lungs CTA. Abdomen soft, nontender  Medical Decision Making  Medically screening exam initiated at 3:17 PM.  Appropriate orders placed.  DOLORES EWING was informed that the remainder of the evaluation will be completed by another provider, this initial triage assessment does not replace that evaluation, and the importance of remaining in the ED until their evaluation is complete.  ACS workup   Claudie Leach, PA-C 02/07/21 1519

## 2021-02-07 NOTE — ED Triage Notes (Signed)
Pt arrived POV from home c/o CP that started yesterday. Pt states  pain does radiate into her neck. Pt denies any SHOB.

## 2021-02-07 NOTE — ED Notes (Signed)
No answer in lobby x 2.

## 2021-02-08 NOTE — ED Notes (Signed)
Patient verbalizes understanding of d/c instructions. Opportunities for questions and answers were provided. Pt d/c from ED and ambulated to lobby.  

## 2021-02-08 NOTE — ED Provider Notes (Signed)
[facility] EMERGENCY DEPARTMENT Provider Note   CSN: [id number] Arrival date & time: 02/07/21  1435     History  Chief Complaint  Patient presents with   Chest Pain    Stacy Orozco is a 76 y.o. female.   Chest Pain Associated symptoms: no diaphoresis, no fever, no numbness, no shortness of breath, no vomiting and no weakness    HPI: A 76 year old patient with a history of hypertension and hypercholesterolemia presents for evaluation of chest pain. Initial onset of pain was more than 6 hours ago. The patient's chest pain is described as heaviness/pressure/tightness and is not worse with exertion. The patient's chest pain is middle- or left-sided, is not well-localized, is not sharp and does not radiate to the arms/jaw/neck. The patient does not complain of nausea and denies diaphoresis. The patient has no history of stroke, has no history of peripheral artery disease, has not smoked in the past 90 days, denies any history of treated diabetes, has no relevant family history of coronary artery disease (first degree relative at less than age 38) and does not have an elevated BMI (>=30).  Patient tells me that she has had pain consistently for over 24 hours.  Nothing makes it worse. No pleuritic pain.  Denies known history of CAD. Reports she had COVID back in December while she was visiting [place].  She has been back since around January 1 Reports recently seen for heart palpitations  Home Medications Prior to Admission medications   Medication Sig Start Date End Date Taking? Authorizing Provider  amitriptyline (ELAVIL) 10 MG tablet Take 10 mg by mouth at bedtime. 04/20/20   [provider]  atorvastatin (LIPITOR) 20 MG tablet Take 20 mg by mouth daily.    [provider]  calcium carbonate (OS-CAL) 600 MG TABS tablet Take 600 mg by mouth 2 (two) times daily with a meal.    [provider]  citalopram (CELEXA) 20 MG tablet Take 1 tablet (20  mg total) by mouth daily. 05/22/15   Gareth Morgan, MD  cyclobenzaprine (FLEXERIL) 10 MG tablet Take 1 tablet (10 mg total) by mouth 2 (two) times daily as needed for muscle spasms. 07/14/20   Valarie Merino, MD  glipiZIDE (GLUCOTROL XL) 5 MG 24 hr tablet Take 5 mg by mouth daily. 09/12/19   [provider]  ibuprofen (ADVIL,MOTRIN) 200 MG tablet Take 200 mg by mouth every 6 (six) hours as needed for moderate pain.    [provider]  losartan (COZAAR) 50 MG tablet Take 50 mg by mouth daily. 04/20/20   [provider]  metFORMIN (GLUCOPHAGE-XR) 500 MG 24 hr tablet Take 500-1,000 mg by mouth 2 (two) times daily. 500 mg at supper & 1000 mg at bedtime 04/24/15   [provider]  naproxen sodium (ALEVE) 220 MG tablet Take 220 mg by mouth daily as needed (pain).    [provider]  vitamin E 400 UNIT capsule Take 400 Units by mouth daily.    [provider]      Allergies    Penicillins    Review of Systems   Review of Systems  Constitutional:  Negative for diaphoresis and fever.  Respiratory:  Negative for shortness of breath.   Cardiovascular:  Positive for chest pain. Negative for leg swelling.  Gastrointestinal:  Negative for vomiting.  Neurological:  Negative for weakness and numbness.  All other systems reviewed and are negative.  Physical Exam Updated Vital Signs BP (!) 156/88  Pulse (!) 58    Temp 98.5 F (36.9 C) (Oral)    Resp 18    Ht 1.676 m (5\' 6" )    Wt 68 kg    SpO2 97%    BMI 24.21 kg/m  Physical Exam CONSTITUTIONAL: Well developed/well nourished HEAD: Normocephalic/atraumatic EYES: EOMI/PERRL ENMT: Mucous membranes moist NECK: supple no meningeal signs SPINE/BACK:entire spine nontender, cervical paraspinal tenderness CV: S1/S2 noted, no murmurs/rubs/gallops noted LUNGS: Lungs are clear to auscultation bilaterally, no apparent distress ABDOMEN: soft, nontender, no rebound or guarding, bowel sounds noted throughout  abdomen GU:no cva tenderness NEURO: Pt is awake/alert/appropriate, moves all extremitiesx4.  No facial droop.   EXTREMITIES: pulses normal/equal, full ROM, no calf tenderness or edema SKIN: warm, color normal PSYCH: no abnormalities of mood noted, alert and oriented to situation  ED Results / Procedures / Treatments   Labs (all labs ordered are listed, but only abnormal results are displayed) Labs Reviewed  BASIC METABOLIC PANEL - Abnormal; Notable for the following components:      Result Value   Glucose, Bld 204 (*)    All other components within normal limits  CBC - Abnormal; Notable for the following components:   RBC 3.66 (*)    Hemoglobin 11.2 (*)    HCT 34.6 (*)    All other components within normal limits  TROPONIN I (HIGH SENSITIVITY)  TROPONIN I (HIGH SENSITIVITY)    EKG EKG Interpretation  Date/Time:  Thursday February 07 2021 15:21:05 EST Ventricular Rate:  64 PR Interval:  170 QRS Duration: 88 QT Interval:  408 QTC Calculation: 420 R Axis:   4 Text Interpretation: Normal sinus rhythm Normal ECG When compared with ECG of 14-Jul-2020 09:51, PREVIOUS ECG IS PRESENT No significant change since last tracing Confirmed by Ripley Fraise [phone]) on 02/08/2021 12:11:53 AM  Radiology DG Chest 2 View  Result Date: 02/07/2021 CLINICAL DATA:  Chest pain EXAM: CHEST - 2 VIEW COMPARISON:  01/31/2021 FINDINGS: The heart size and mediastinal contours are within normal limits. Both lungs are clear. The visualized skeletal structures are unremarkable. IMPRESSION: No active cardiopulmonary disease. Electronically Signed   By: Donavan Foil M.D.   On: 02/07/2021 15:52    Procedures Procedures    Medications Ordered in ED Medications - No data to display  ED Course/ Medical Decision Making/ A&P   HEAR Score: 4                       Medical Decision Making  This patient presents to the ED for concern of chest pain, this involves an extensive number of treatment options, and  is a complaint that carries with it a high risk of complications and morbidity.  The differential diagnosis includes acute coronary syndrome, pulmonary embolism, aortic dissection, pericarditis, pneumonia, pneumothorax  Comorbidities that complicate the patient evaluation: Patients presentation is complicated by their history of hypertension hyperlipidemia  Social Determinants of Health: Patients husband is in a nursing home increases the complexity of managing their presentation  Additional history obtained:  Records reviewed Primary Care Documents  Lab Tests: I Ordered, and personally interpreted labs.  The pertinent results include: Mild chronic anemia  Imaging Studies ordered: I ordered imaging studies including X-ray chest I independently visualized and interpreted imaging which showed no acute findings I agree with the radiologist interpretation  Cardiac Monitoring: The patient was maintained on a cardiac monitor.  I personally viewed and interpreted the cardiac monitor which showed an underlying rhythm of:  sinus rhythm  Test Considered: Patient is low risk / negative by heart score, therefore do not feel that admission is indicated.  Complexity of problems addressed: Patients presentation is most consistent with  acute, uncomplicated illness      Disposition: After consideration of the diagnostic results and the patients response to treatment,  I feel that the patent would benefit from discharge  Patient reports consistent chest pain for 24 hours.  Also reports neck soreness that is common for her and is reproducible on exam.  The chest pain does not radiate to  neck At this point, patient is appropriate discharge home.  I have low suspicion for ACS/PE/dissection She already has follow-up with cardiology next month.  We discussed strict return precautions         Final Clinical Impression(s) / ED Diagnoses Final diagnoses:  Precordial pain  Primary  hypertension    Rx / DC Orders ED Discharge Orders     None         Ripley Fraise, MD 02/08/21 [phone]

## 2021-02-08 NOTE — Discharge Instructions (Signed)

## 2021-02-21 ENCOUNTER — Encounter (INDEPENDENT_AMBULATORY_CARE_PROVIDER_SITE_OTHER): Payer: Self-pay | Admitting: Ophthalmology

## 2021-02-21 ENCOUNTER — Ambulatory Visit (INDEPENDENT_AMBULATORY_CARE_PROVIDER_SITE_OTHER): Payer: Medicare HMO | Admitting: Ophthalmology

## 2021-02-21 ENCOUNTER — Other Ambulatory Visit: Payer: Self-pay

## 2021-02-21 DIAGNOSIS — H2513 Age-related nuclear cataract, bilateral: Secondary | ICD-10-CM

## 2021-02-21 DIAGNOSIS — H34832 Tributary (branch) retinal vein occlusion, left eye, with macular edema: Secondary | ICD-10-CM | POA: Diagnosis not present

## 2021-02-21 DIAGNOSIS — H353132 Nonexudative age-related macular degeneration, bilateral, intermediate dry stage: Secondary | ICD-10-CM

## 2021-02-21 HISTORY — DX: Age-related nuclear cataract, bilateral: H25.13

## 2021-02-21 MED ORDER — BEVACIZUMAB 2.5 MG/0.1ML IZ SOSY
2.5000 mg | PREFILLED_SYRINGE | INTRAVITREAL | Status: AC | PRN
  Administered 2021-02-21: 2.5 mg via INTRAVITREAL

## 2021-02-21 NOTE — Assessment & Plan Note (Signed)
Moderate OU follow-up with Dr. Warden Fillers as scheduled

## 2021-02-21 NOTE — Assessment & Plan Note (Signed)
OS, with center involved CME recurrent currently at follow-up interval of 9 weeks and 3 days.  Will need repeat injection today and shorten interval follow-up to 6 weeks

## 2021-02-21 NOTE — Assessment & Plan Note (Signed)
Mild to moderate HI stable no active disease

## 2021-02-21 NOTE — Progress Notes (Signed)
02/21/2021     CHIEF COMPLAINT Patient presents for  Chief Complaint  Patient presents with   Retina Follow Up      HISTORY OF PRESENT ILLNESS: Stacy Orozco is a 76 y.o. female who presents to the clinic today for:   HPI     Retina Follow Up           Diagnosis: CRVO/BRVO   Laterality: left eye   Onset: 9 weeks ago   Severity: mild   Duration: 9 weeks   Course: gradually worsening         Comments   9 weeks and 3 days fu dilate OD, Avastin OCT. (Pt had COVID 01/28/2021, tested - since).  History of branch retinal vein occlusion and CME left eye, A long follow-up interval at this time because of recent COVID illness Pt states "vision is ok with my glasses but sometimes I feel like there is some cloudiness over my vision. I think I see Dr. Katy Fitch about my cataracts soon but I am not sure."       Last edited by Hurman Horn, MD on 02/21/2021  9:59 AM.      Referring physician: Warden Fillers, MD [address] [place],  [place] [id number]  HISTORICAL INFORMATION:   Selected notes from the [facility]: No current outpatient medications on file. (Ophthalmic Drugs)   No current facility-administered medications for this visit. (Ophthalmic Drugs)   Current Outpatient Medications (Other)  Medication Sig   amitriptyline (ELAVIL) 10 MG tablet Take 10 mg by mouth at bedtime.   atorvastatin (LIPITOR) 20 MG tablet Take 20 mg by mouth daily.   calcium carbonate (OS-CAL) 600 MG TABS tablet Take 600 mg by mouth 2 (two) times daily with a meal.   citalopram (CELEXA) 20 MG tablet Take 1 tablet (20 mg total) by mouth daily.   cyclobenzaprine (FLEXERIL) 10 MG tablet Take 1 tablet (10 mg total) by mouth 2 (two) times daily as needed for muscle spasms.   glipiZIDE (GLUCOTROL XL) 5 MG 24 hr tablet Take 5 mg by mouth daily.   ibuprofen (ADVIL,MOTRIN) 200 MG tablet Take 200 mg by mouth every 6 (six) hours as needed for moderate pain.    losartan (COZAAR) 50 MG tablet Take 50 mg by mouth daily.   metFORMIN (GLUCOPHAGE-XR) 500 MG 24 hr tablet Take 500-1,000 mg by mouth 2 (two) times daily. 500 mg at supper & 1000 mg at bedtime   naproxen sodium (ALEVE) 220 MG tablet Take 220 mg by mouth daily as needed (pain).   vitamin E 400 UNIT capsule Take 400 Units by mouth daily.   No current facility-administered medications for this visit. (Other)      REVIEW OF SYSTEMS: ROS   Negative for: Constitutional, Gastrointestinal, Neurological, Skin, Genitourinary, Musculoskeletal, HENT, Endocrine, Cardiovascular, Eyes, Respiratory, Psychiatric, Allergic/Imm, Heme/Lymph Last edited by Hurman Horn, MD on 02/21/2021  9:59 AM.       ALLERGIES Allergies  Allergen Reactions   Penicillins Rash    Has patient had a PCN reaction causing immediate rash, facial/tongue/throat swelling, SOB or lightheadedness with hypotension:  No (pt did experience minor rash)  Has patient had a PCN reaction causing severe rash involving mucus membranes or skin necrosis: no Has patient had a PCN reaction that required hospitalization: no Has patient had a PCN reaction occurring within the last 10 years: no If all of the above answers are "  NO", then may proceed with Cephalosporin use.     PAST MEDICAL HISTORY Past Medical History:  Diagnosis Date   Depression    Diabetes mellitus without complication ([place])    diet controlled   Hypercholesteremia    Hyperlipemia    Past Surgical History:  Procedure Laterality Date   TUBAL LIGATION      FAMILY HISTORY History reviewed. No pertinent family history.  SOCIAL HISTORY Social History   Tobacco Use   Smoking status: Never    Passive exposure: Never   Smokeless tobacco: Never  Substance Use Topics   Alcohol use: No   Drug use: No         OPHTHALMIC EXAM:  Base Eye Exam     Visual Acuity (ETDRS)       Right Left   Dist [place] 20/70 20/40 -2   Dist ph [place] 20/30 -1 20/30 -1          Tonometry (Tonopen, 9:31 AM)       Right Left   Pressure 15 18         Pupils       Pupils Dark Light APD   Right PERRL 5 4 None   Left PERRL 5 4 None         Visual Fields       Left Right    Full Full         Extraocular Movement       Right Left    Full, Ortho Full, Ortho         Neuro/Psych     Oriented x3: Yes   Mood/Affect: Normal         Dilation     Left eye: 1.0% Mydriacyl, 2.5% Phenylephrine @ 9:31 AM           Slit Lamp and Fundus Exam     External Exam       Right Left   External Normal Normal         Slit Lamp Exam       Right Left   Lids/Lashes Normal Normal   Conjunctiva/Sclera White and quiet White and quiet   Cornea Clear Clear   Anterior Chamber Deep and quiet Deep and quiet   Iris Round and reactive Round and reactive   Lens 2.5+ Nuclear sclerosis 2.5+ Nuclear sclerosis   Anterior Vitreous Normal Normal         Fundus Exam       Right Left   Posterior Vitreous  Normal   Disc  Normal   C/D Ratio 0.2 0.2   Macula  Microaneurysms, Intraretinal hemorrhage macula, inferiorly   Vessels  Normal   Periphery  Normal            IMAGING AND PROCEDURES  Imaging and Procedures for 02/21/21  Intravitreal Injection, Pharmacologic Agent - OS - Left Eye       Time Out 02/21/2021. 9:59 AM. Confirmed correct patient, procedure, site, and patient consented.   Anesthesia Topical anesthesia was used. Anesthetic medications included Akten 3.5%.   Procedure Preparation included 10% betadine to eyelids, 5% betadine to ocular surface, Tobramycin 0.3%. A 30 gauge needle was used.   Injection: 2.5 mg bevacizumab 2.5 MG/0.1ML   Route: Intravitreal, Site: Left Eye   NDC: [phone], Lot: [id number]   Post-op Post injection exam found visual acuity of at least counting fingers. The patient tolerated the procedure well. There were no complications. The patient received written and verbal post procedure care  education.  Post injection medications included ocuflox.      OCT, Retina - OU - Both Eyes       Right Eye Quality was good. Central Foveal Thickness: 296. Progression has been stable. Findings include retinal drusen , normal foveal contour.   Left Eye Quality was good. Scan locations included subfoveal. Central Foveal Thickness: 426. Progression has worsened. Findings include retinal drusen , cystoid macular edema.   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  OS with worse CME at 8-week follow-up interval today.  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  Will need repeat injection today and shorten interval follow-up to 6 weeks  Nuclear sclerotic cataract of both eyes Moderate OU follow-up with Dr. Warden Fillers as scheduled  Intermediate stage nonexudative age-related macular degeneration of both eyes Mild to moderate HI stable no active disease     ICD-10-CM   1. 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 Nuclear sclerotic cataract of both eyes  H25.13     3. Intermediate stage nonexudative age-related macular degeneration of both eyes  H35.3132       1.  Repeat intravitreal Avastin OS today, follow-up in short interval 6 weeks  2.  OU okay to proceed with cataract surgery at any time as per Dr. Warden Fillers  3.  Mild ARMD, no high risk features  Ophthalmic Meds Ordered this visit:  Meds ordered this encounter  Medications   bevacizumab (AVASTIN) SOSY 2.5 mg       Return in about 6 weeks (around 04/04/2021) for dilate, OS,  AVASTIN OCT.  There are no Patient Instructions on file for this visit.   Explained the diagnoses, plan, and follow up with the patient and they expressed understanding.  Patient expressed understanding of the importance of proper follow up care.   Clent Demark Akira Adelsberger M.D. Diseases & Surgery of the Retina and Vitreous Retina & Diabetic [place] 02/21/21     Abbreviations: M myopia (nearsighted); A astigmatism; H hyperopia (farsighted); P presbyopia; Mrx spectacle prescription;  CTL contact lenses; OD right eye; OS left eye; OU both eyes  XT exotropia; ET esotropia; PEK punctate epithelial keratitis; PEE punctate epithelial erosions; DES dry eye syndrome; MGD meibomian gland dysfunction; ATs artificial tears; PFAT's preservative free artificial tears; [place] nuclear sclerotic cataract; PSC posterior subcapsular cataract; ERM epi-retinal membrane; PVD posterior vitreous detachment; RD retinal detachment; DM diabetes mellitus; DR diabetic retinopathy; NPDR non-proliferative diabetic retinopathy; PDR proliferative diabetic retinopathy; CSME clinically significant macular edema; DME diabetic macular edema; dbh dot blot hemorrhages; CWS cotton wool spot; POAG primary open angle glaucoma; C/D cup-to-disc ratio; HVF humphrey visual field; GVF goldmann visual field; OCT optical coherence tomography; IOP intraocular pressure; BRVO Branch retinal vein occlusion; CRVO central retinal vein occlusion; CRAO central retinal artery occlusion; BRAO branch retinal artery occlusion; RT retinal tear; SB scleral buckle; PPV pars plana vitrectomy; VH Vitreous hemorrhage; PRP panretinal laser photocoagulation; IVK intravitreal kenalog; VMT vitreomacular traction; MH Macular hole;  NVD  neovascularization of the disc; NVE neovascularization elsewhere; AREDS age related eye disease study; ARMD age related macular degeneration; POAG primary open angle glaucoma; EBMD epithelial/anterior basement membrane dystrophy; ACIOL anterior  chamber intraocular lens; IOL intraocular lens; PCIOL posterior chamber intraocular lens; Phaco/IOL phacoemulsification with intraocular lens placement; [place] photorefractive keratectomy; LASIK laser assisted in situ keratomileusis; HTN hypertension; DM diabetes mellitus; COPD chronic obstructive pulmonary disease

## 2021-03-06 ENCOUNTER — Encounter: Payer: Self-pay | Admitting: Cardiovascular Disease

## 2021-03-06 ENCOUNTER — Other Ambulatory Visit: Payer: Self-pay

## 2021-03-06 ENCOUNTER — Ambulatory Visit: Payer: Medicare HMO | Admitting: Cardiovascular Disease

## 2021-03-06 DIAGNOSIS — E782 Mixed hyperlipidemia: Secondary | ICD-10-CM

## 2021-03-06 DIAGNOSIS — R0789 Other chest pain: Secondary | ICD-10-CM | POA: Diagnosis not present

## 2021-03-06 DIAGNOSIS — I10 Essential (primary) hypertension: Secondary | ICD-10-CM

## 2021-03-06 DIAGNOSIS — E785 Hyperlipidemia, unspecified: Secondary | ICD-10-CM | POA: Insufficient documentation

## 2021-03-06 NOTE — Assessment & Plan Note (Signed)
Recent episode of atypical chest pain on 02/08/2021 evaluated in the emergency room with a negative work-up.  She does have positive risk factors.  I am going to get a coronary calcium score to risk stratify.

## 2021-03-06 NOTE — Assessment & Plan Note (Addendum)
History of hyperlipidemia on statin therapy followed by her PCP.  Her most recent lipid profile performed 09/27/2020 revealed a total cholesterol of 151, LDL of 92 and HDL 45.

## 2021-03-06 NOTE — Assessment & Plan Note (Signed)
History of essential hypertension blood pressure measured today of 146/72.  She is on losartan.

## 2021-03-06 NOTE — Progress Notes (Signed)
03/06/2021 Stacy Orozco   Jul 11, 1945  [id number]  Primary Physician Bess Harvest Ruthell Rummage, NP Primary Cardiologist: Lorretta Harp MD Stacy Orozco, [place]  HPI:  Stacy Orozco is a 76 y.o. moderately overweight married Caucasian female mother of 3 daughters, grandmother of 59 grandchildren whose husband unfortunately is in skilled nursing facility because of dementia.  She was referred by her primary care provider, Stacy Orozco nurse practitioner, for atypical chest pain.  She is retired from being a Engineer, technical sales at [employer].  Her risk factors include treated hypertension, diabetes and hyperlipidemia.  Her father did have CABG at age 72 but lived until he was 77 years old.  She is never had a heart attack or stroke.  She did have COVID at the end of December and had transient palpitations which resolved spontaneously.  She had an episode of chest pain rating to her back and neck on 02/08/2021 resulting in an ER visit but unrevealing work-up.  She has had no recurrent symptoms.   No outpatient medications have been marked as taking for the 03/06/21 encounter (Office Visit) with Lorretta Harp, MD.     Allergies  Allergen Reactions   Penicillins Rash    Has patient had a PCN reaction causing immediate rash, facial/tongue/throat swelling, SOB or lightheadedness with hypotension:  No (pt did experience minor rash)  Has patient had a PCN reaction causing severe rash involving mucus membranes or skin necrosis: no Has patient had a PCN reaction that required hospitalization: no Has patient had a PCN reaction occurring within the last 10 years: no If all of the above answers are "NO", then may proceed with Cephalosporin use.     Social History   Socioeconomic History   Marital status: Married    Spouse name: Not on file   Number of children: Not on file   Years of education: Not on file   Highest education level: Not on file  Occupational History   Not on file   Tobacco Use   Smoking status: Never    Passive exposure: Never   Smokeless tobacco: Never  Substance and Sexual Activity   Alcohol use: No   Drug use: No   Sexual activity: Not on file  Other Topics Concern   Not on file  Social History Narrative   Not on file   Social Determinants of Health   Financial Resource Strain: Not on file  Food Insecurity: Not on file  Transportation Needs: Not on file  Physical Activity: Not on file  Stress: Not on file  Social Connections: Not on file  Intimate Partner Violence: Not on file     Review of Systems: General: negative for chills, fever, night sweats or weight changes.  Cardiovascular: negative for chest pain, dyspnea on exertion, edema, orthopnea, palpitations, paroxysmal nocturnal dyspnea or shortness of breath Dermatological: negative for rash Respiratory: negative for cough or wheezing Urologic: negative for hematuria Abdominal: negative for nausea, vomiting, diarrhea, bright red blood per rectum, melena, or hematemesis Neurologic: negative for visual changes, syncope, or dizziness All other systems reviewed and are otherwise negative except as noted above.    Blood pressure (!) 146/72, pulse 67, height 5\' 6"  (1.676 m), weight 151 lb 12.8 oz (68.9 kg).  General appearance: alert and no distress Neck: no adenopathy, no carotid bruit, no JVD, supple, symmetrical, trachea midline, and thyroid not enlarged, symmetric, no tenderness/mass/nodules Lungs: clear to auscultation bilaterally Heart: regular rate and rhythm, S1, S2 normal, no  murmur, click, rub or gallop Extremities: extremities normal, atraumatic, no cyanosis or edema Pulses: 2+ and symmetric Skin: Skin color, texture, turgor normal. No rashes or lesions Neurologic: Grossly normal  EKG sinus rhythm at 67 without ST or T wave changes.  Personally reviewed this EKG.  ASSESSMENT AND PLAN:   Hyperlipidemia History of hyperlipidemia on statin therapy followed by her  PCP.  Her most recent lipid profile performed 09/27/2020 revealed a total cholesterol of 151, LDL of 92 and HDL 45.  Essential hypertension History of essential hypertension blood pressure measured today of 146/72.  She is on losartan.  Atypical chest pain Recent episode of atypical chest pain on 02/08/2021 evaluated in the emergency room with a negative work-up.  She does have positive risk factors.  I am going to get a coronary calcium score to risk stratify.     Lorretta Harp MD FACP,FACC,FAHA, [facility] 03/06/2021 2:03 PM

## 2021-03-06 NOTE — Patient Instructions (Signed)
Medication Instructions:  °Your physician recommends that you continue on your current medications as directed. Please refer to the Current Medication list given to you today. ° °*If you need a refill on your cardiac medications before your next appointment, please call your pharmacy* ° ° °Testing/Procedures: °Dr. Berry has ordered a CT coronary calcium score.  ° °Test location:  °[facility] ([address] Floor [place], [place] [address]) ° ° °This is $99 out of pocket. ° ° °Coronary CalciumScan °A coronary calcium scan is an imaging test used to look for deposits of calcium and other fatty materials (plaques) in the inner lining of the blood vessels of the heart (coronary arteries). These deposits of calcium and plaques can partly clog and narrow the coronary arteries without producing any symptoms or warning signs. This puts a person at risk for a heart attack. This test can detect these deposits before symptoms develop. °Tell a health care provider about: °Any allergies you have. °All medicines you are taking, including vitamins, herbs, eye drops, creams, and over-the-counter medicines. °Any problems you or family members have had with anesthetic medicines. °Any blood disorders you have. °Any surgeries you have had. °Any medical conditions you have. °Whether you are pregnant or may be pregnant. °What are the risks? °Generally, this is a safe procedure. However, problems may occur, including: °Harm to a pregnant woman and her unborn baby. This test involves the use of radiation. Radiation exposure can be dangerous to a pregnant woman and her unborn baby. If you are pregnant, you generally should not have this procedure done. °Slight increase in the risk of cancer. This is because of the radiation involved in the test. °What happens before the procedure? °No preparation is needed for this procedure. °What happens during the procedure? °You will undress and remove any jewelry around your neck or chest. °You  will put on a hospital gown. °Sticky electrodes will be placed on your chest. The electrodes will be connected to an electrocardiogram (ECG) machine to record a tracing of the electrical activity of your heart. °A CT scanner will take pictures of your heart. During this time, you will be asked to lie still and hold your breath for 2-3 seconds while a picture of your heart is being taken. °The procedure may vary among health care providers and hospitals. °What happens after the procedure? °You can get dressed. °You can return to your normal activities. °It is up to you to get the results of your test. Ask your health care provider, or the department that is doing the test, when your results will be ready. °Summary °A coronary calcium scan is an imaging test used to look for deposits of calcium and other fatty materials (plaques) in the inner lining of the blood vessels of the heart (coronary arteries). °Generally, this is a safe procedure. Tell your health care provider if you are pregnant or may be pregnant. °No preparation is needed for this procedure. °A CT scanner will take pictures of your heart. °You can return to your normal activities after the scan is done. °This information is not intended to replace advice given to you by your health care provider. Make sure you discuss any questions you have with your health care provider. °Document Released: 07/12/2007 Document Revised: 12/03/2015 Document Reviewed: 12/03/2015 °Elsevier Interactive Patient Education © 2017 [employer] ° ° ° °Follow-Up: °At [facility], you and your health needs are our priority.  As part of our continuing mission to provide you with exceptional heart   care, we have created designated Provider Care Teams.  These Care Teams include your primary Cardiologist (physician) and Advanced Practice Providers (APPs -  Physician Assistants and Nurse Practitioners) who all work together to provide you with the care you need, when you need  it. ° °We recommend signing up for the patient portal called "MyChart".  Sign up information is provided on this After Visit Summary.  MyChart is used to connect with patients for Virtual Visits (Telemedicine).  Patients are able to view lab/test results, encounter notes, upcoming appointments, etc.  Non-urgent messages can be sent to your provider as well.   °To learn more about what you can do with MyChart, go to [email or web address].   ° °Your next appointment:   °We will see you on an as needed basis. ° °Provider:   °Jonathan Berry, MD  °

## 2021-04-04 ENCOUNTER — Encounter (INDEPENDENT_AMBULATORY_CARE_PROVIDER_SITE_OTHER): Payer: Medicare HMO | Admitting: Ophthalmology

## 2021-04-08 ENCOUNTER — Other Ambulatory Visit: Payer: Self-pay

## 2021-04-08 ENCOUNTER — Ambulatory Visit (INDEPENDENT_AMBULATORY_CARE_PROVIDER_SITE_OTHER): Payer: Medicare HMO | Admitting: Ophthalmology

## 2021-04-08 ENCOUNTER — Encounter (INDEPENDENT_AMBULATORY_CARE_PROVIDER_SITE_OTHER): Payer: Self-pay | Admitting: Ophthalmology

## 2021-04-08 DIAGNOSIS — H2513 Age-related nuclear cataract, bilateral: Secondary | ICD-10-CM

## 2021-04-08 DIAGNOSIS — H34832 Tributary (branch) retinal vein occlusion, left eye, with macular edema: Secondary | ICD-10-CM

## 2021-04-08 MED ORDER — BEVACIZUMAB 2.5 MG/0.1ML IZ SOSY
2.5000 mg | PREFILLED_SYRINGE | INTRAVITREAL | Status: AC | PRN
  Administered 2021-04-08: 2.5 mg via INTRAVITREAL

## 2021-04-08 NOTE — Assessment & Plan Note (Signed)
Follow-up Dr. Marchelle Gearing ?

## 2021-04-08 NOTE — Assessment & Plan Note (Signed)
OS vastly improved now at 6-week interval follow-up as compared to 8 weeks previous.  Repeat injection today maintain 6-week follow-up evaluation OS ?

## 2021-04-08 NOTE — Progress Notes (Signed)
04/08/2021     CHIEF COMPLAINT Patient presents for  Chief Complaint  Patient presents with   Branch Retinal Vein Occlusion      HISTORY OF PRESENT ILLNESS: Stacy Orozco is a 76 y.o. female who presents to the clinic today for:   HPI   6 weeks for dilate OS avastin oct. Pt states, "Sometimes when I go into my den I do notice a little flash of light. But that has been going on for a long time." Pt denies floaters. Pt states no changes for medical history.  Last edited by Angeline Slim on 04/08/2021  9:34 AM.      Referring physician: Sallye Lat, MD [address] [place],  [place] [address]  HISTORICAL INFORMATION:   Selected notes from the MEDICAL RECORD NUMBER       CURRENT MEDICATIONS: No current outpatient medications on file. (Ophthalmic Drugs)   No current facility-administered medications for this visit. (Ophthalmic Drugs)   Current Outpatient Medications (Other)  Medication Sig   amitriptyline (ELAVIL) 10 MG tablet Take 10 mg by mouth at bedtime.   atorvastatin (LIPITOR) 20 MG tablet Take 20 mg by mouth daily.   calcium carbonate (OS-CAL) 600 MG TABS tablet Take 600 mg by mouth 2 (two) times daily with a meal.   citalopram (CELEXA) 20 MG tablet Take 1 tablet (20 mg total) by mouth daily.   cyclobenzaprine (FLEXERIL) 10 MG tablet Take 1 tablet (10 mg total) by mouth 2 (two) times daily as needed for muscle spasms.   glipiZIDE (GLUCOTROL XL) 5 MG 24 hr tablet Take 5 mg by mouth daily.   ibuprofen (ADVIL,MOTRIN) 200 MG tablet Take 200 mg by mouth every 6 (six) hours as needed for moderate pain.   losartan (COZAAR) 50 MG tablet Take 50 mg by mouth daily.   metFORMIN (GLUCOPHAGE-XR) 500 MG 24 hr tablet Take 500-1,000 mg by mouth 2 (two) times daily. 500 mg at supper & 1000 mg at bedtime   naproxen sodium (ALEVE) 220 MG tablet Take 220 mg by mouth daily as needed (pain).   vitamin E 400 UNIT capsule Take 400 Units by mouth daily.   No current  facility-administered medications for this visit. (Other)      REVIEW OF SYSTEMS: ROS   Negative for: Constitutional, Gastrointestinal, Neurological, Skin, Genitourinary, Musculoskeletal, HENT, Endocrine, Cardiovascular, Eyes, Respiratory, Psychiatric, Allergic/Imm, Heme/Lymph Last edited by Angeline Slim on 04/08/2021  9:34 AM.       ALLERGIES Allergies  Allergen Reactions   Penicillins Rash    Has patient had a PCN reaction causing immediate rash, facial/tongue/throat swelling, SOB or lightheadedness with hypotension:  No (pt did experience minor rash)  Has patient had a PCN reaction causing severe rash involving mucus membranes or skin necrosis: no Has patient had a PCN reaction that required hospitalization: no Has patient had a PCN reaction occurring within the last 10 years: no If all of the above answers are "NO", then may proceed with Cephalosporin use.     PAST MEDICAL HISTORY Past Medical History:  Diagnosis Date   Depression    Diabetes mellitus without complication (HCC)    diet controlled   Hypercholesteremia    Hyperlipemia    Past Surgical History:  Procedure Laterality Date   TUBAL LIGATION      FAMILY HISTORY History reviewed. No pertinent family history.  SOCIAL HISTORY Social History   Tobacco Use   Smoking status: Never    Passive exposure: Never  Smokeless tobacco: Never  Substance Use Topics   Alcohol use: No   Drug use: No         OPHTHALMIC EXAM:  Base Eye Exam     Visual Acuity (ETDRS)       Right Left   Dist [place] 20/70 20/50   Dist ph [place] 20/60 -1 20/30 -2         Tonometry (Tonopen, 9:41 AM)       Right Left   Pressure 16 12         Pupils       Pupils Dark Light Shape React APD   Right PERRL 4 4 Round Slow None   Left PERRL 4 4 Round Slow None         Visual Fields       Left Right    Full Full         Extraocular Movement       Right Left    Full Full         Neuro/Psych     Oriented x3: Yes    Mood/Affect: Normal         Dilation     Left eye: 1.0% Mydriacyl, 2.5% Phenylephrine @ 9:41 AM           Slit Lamp and Fundus Exam     External Exam       Right Left   External Normal Normal         Slit Lamp Exam       Right Left   Lids/Lashes Normal Normal   Conjunctiva/Sclera White and quiet White and quiet   Cornea Clear Clear   Anterior Chamber Deep and quiet Deep and quiet   Iris Round and reactive Round and reactive   Lens 2.5+ Nuclear sclerosis 2.5+ Nuclear sclerosis   Anterior Vitreous Normal Normal         Fundus Exam       Right Left   Posterior Vitreous  Normal   Disc  Normal   C/D Ratio 0.2 0.2   Macula  Microaneurysms, Intraretinal hemorrhage macula, inferiorly much less CME OS   Vessels  Normal   Periphery  Normal            IMAGING AND PROCEDURES  Imaging and Procedures for 04/08/21  OCT, Retina - OU - Both Eyes       Right Eye Quality was good. Central Foveal Thickness: 300. Progression has been stable. Findings include retinal drusen , normal foveal contour.   Left Eye Quality was good. Scan locations included subfoveal. Central Foveal Thickness: 340. Progression has worsened. Findings include retinal drusen , cystoid macular edema.   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  OS with worse CME at 8-week but now improved at 6-week ffollow-up interval today.  Will need repeat injection Avastin today and follow-up next in 6-week     Intravitreal Injection, Pharmacologic Agent - OS - Left Eye       Time Out 04/08/2021. 10:51 AM. Confirmed correct patient, procedure, site, and patient consented.   Anesthesia Topical anesthesia was used. Anesthetic medications included Lidocaine 4%.   Procedure Preparation included 10% betadine to eyelids, 5% betadine to ocular surface, Tobramycin 0.3%. A 30 gauge needle was used.   Injection: 2.5 mg bevacizumab 2.5  MG/0.1ML   Route: Intravitreal, Site: Left Eye   NDC: [phone]   Post-op Post injection exam found visual  acuity of at least counting fingers. The patient tolerated the procedure well. There were no complications. The patient received written and verbal post procedure care education. Post injection medications included ocuflox.              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  Repeat injection today maintain 6-week follow-up evaluation OS  Nuclear sclerotic cataract of both eyes Follow-up Dr. Marchelle Gearing     ICD-10-CM   1. 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 Nuclear sclerotic cataract of both eyes  H25.13       1.  OS vastly improved today much less CME from BRVO at 6-week interval.  Follow-up again today with injection follow-up next in 6 weeks again  2.  Bilateral cataract, okay to have cataract surgery OU at any time  3.  Ophthalmic Meds Ordered this visit:  Meds ordered this encounter  Medications   bevacizumab (AVASTIN) SOSY 2.5 mg       Return in about 6 weeks (around 05/20/2021) for OS, AVASTIN OCT.  There are no Patient Instructions on file for this visit.   Explained the diagnoses, plan, and follow up with the patient and they expressed understanding.  Patient expressed understanding of the importance of proper follow up care.   Alford Highland Chayden Garrelts M.D. Diseases & Surgery of the Retina and Vitreous [facility] 04/08/21     Abbreviations: M myopia (nearsighted); A astigmatism; H hyperopia (farsighted); P presbyopia; Mrx spectacle prescription;  CTL contact lenses; OD right eye; OS left eye; OU both eyes  XT exotropia; ET esotropia; PEK  punctate epithelial keratitis; PEE punctate epithelial erosions; DES dry eye syndrome; MGD meibomian gland dysfunction; ATs artificial tears; PFAT's preservative free artificial tears; NSC nuclear sclerotic cataract; PSC posterior subcapsular cataract; ERM epi-retinal membrane; PVD posterior vitreous detachment; RD retinal detachment; DM diabetes mellitus; DR diabetic retinopathy; NPDR non-proliferative diabetic retinopathy; PDR proliferative diabetic retinopathy; CSME clinically significant macular edema; DME diabetic macular edema; dbh dot blot hemorrhages; CWS cotton wool spot; POAG primary open angle glaucoma; C/D cup-to-disc ratio; HVF humphrey visual field; GVF goldmann visual field; OCT optical coherence tomography; IOP intraocular pressure; BRVO Branch retinal vein occlusion; CRVO central retinal vein occlusion; CRAO central retinal artery occlusion; BRAO branch retinal artery occlusion; RT retinal tear; SB scleral buckle; PPV pars plana vitrectomy; VH Vitreous hemorrhage; PRP panretinal laser photocoagulation; IVK intravitreal kenalog; VMT vitreomacular traction; MH Macular hole;  NVD neovascularization of the disc; NVE neovascularization elsewhere; AREDS age related eye disease study; ARMD age related macular degeneration; POAG primary open angle glaucoma; EBMD epithelial/anterior basement membrane dystrophy; ACIOL anterior chamber intraocular lens; IOL intraocular lens; PCIOL posterior chamber intraocular lens; Phaco/IOL phacoemulsification with intraocular lens placement; PRK photorefractive keratectomy; LASIK laser assisted in situ keratomileusis; HTN hypertension; DM diabetes mellitus; COPD chronic obstructive pulmonary disease

## 2021-04-25 ENCOUNTER — Ambulatory Visit
Admission: RE | Admit: 2021-04-25 | Discharge: 2021-04-25 | Disposition: A | Discharge status: Home / Self Care | Payer: Self-pay | Source: Ambulatory Visit | Attending: Cardiovascular Disease | Admitting: Cardiovascular Disease

## 2021-04-25 DIAGNOSIS — E782 Mixed hyperlipidemia: Secondary | ICD-10-CM

## 2021-04-25 IMAGING — CT CT CARDIAC CORONARY ARTERY CALCIUM SCORE
3 series · 14 of 20 positions shown, 16 images · non-contrast
Comparison: 02/07/2021 chest radiograph.
COMPARISON: 02/07/2021 chest radiograph.

Addendum:
EXAM:
OVER-READ INTERPRETATION  CT CHEST

The following report is an over-read performed by radiologist Dr.
Shakay Chou [REDACTED] on 04/25/2021. This over-read
does not include interpretation of cardiac or coronary anatomy or
pathology. The calcium score interpretation by the cardiologist is
attached.
CLINICAL DATA: Cardiovascular Disease Risk stratification
Coronary Calcium Score
TECHNIQUE: A gated, non-contrast computed tomography scan of the heart was
performed using 3mm slice thickness. Axial images were analyzed on a
dedicated workstation. Calcium scoring of the coronary arteries was
performed using the Agatston method.

[Series 2: cascseq 2.0 sa36 70% (id) · axial · 0.39mm/px · z∈[-193,-113]mm · 4 of 68 slices shown]
[im 14/68  vessel]
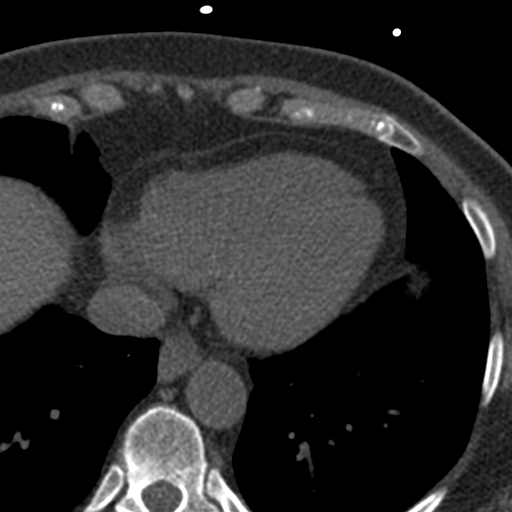
[im 27/68  vessel]
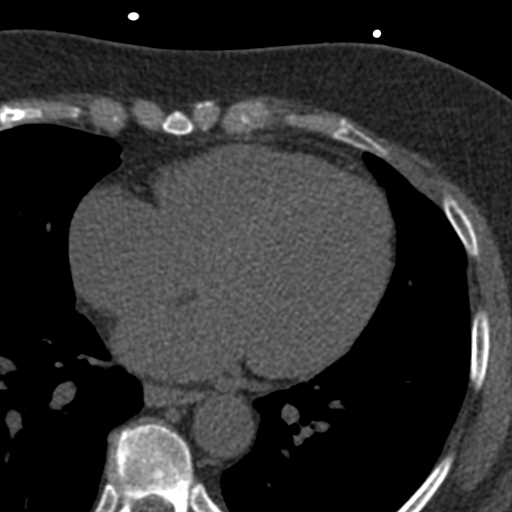
[im 41/68  vessel]
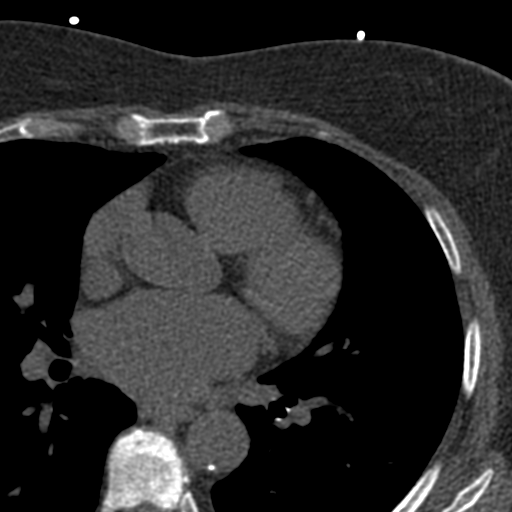
[im 54/68  vessel]
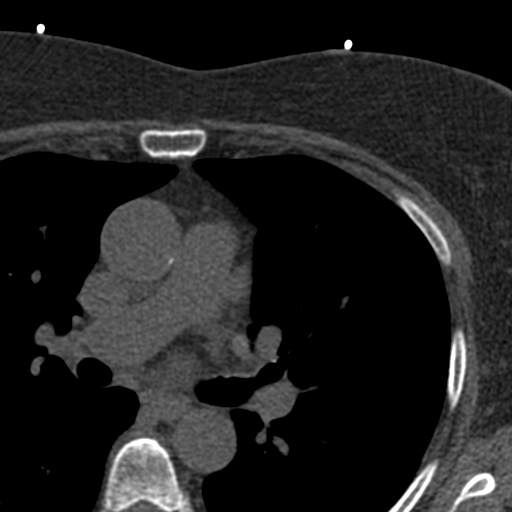

[Series 3: cascseq 2.0 bf37 st · axial · 0.64mm/px · z∈[-197,-109]mm · 5 of 68 slices shown, 7 images]
[im 12/68  vessel]
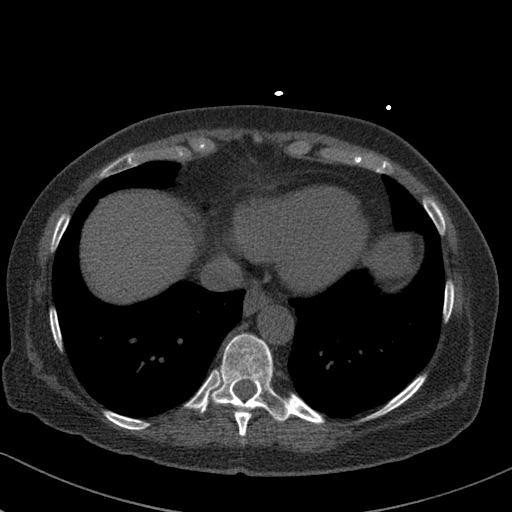
[im 12/68  lung]
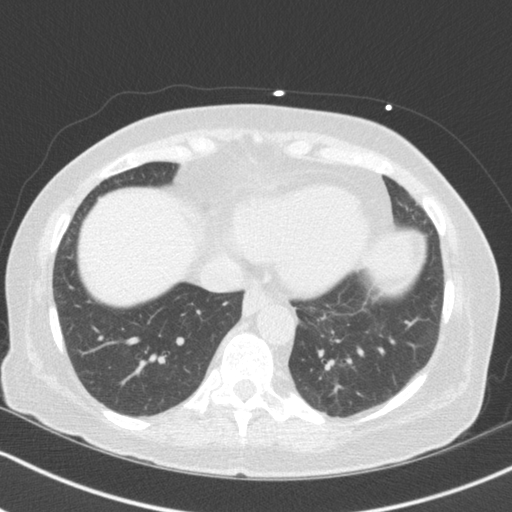
[im 23/68  vessel]
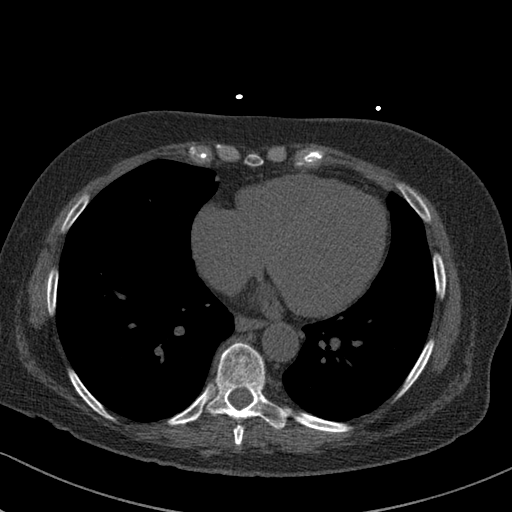
[im 34/68  vessel]
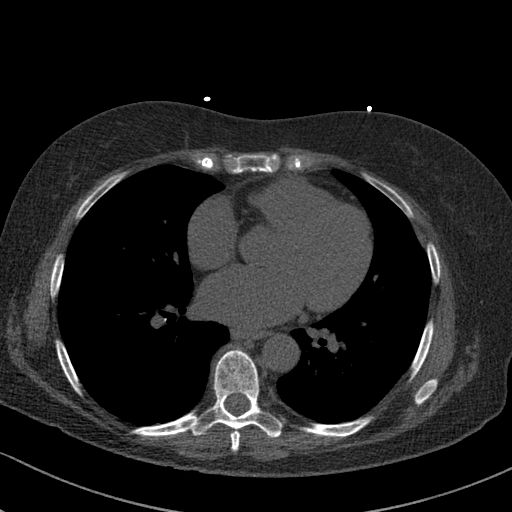
[im 45/68  vessel]
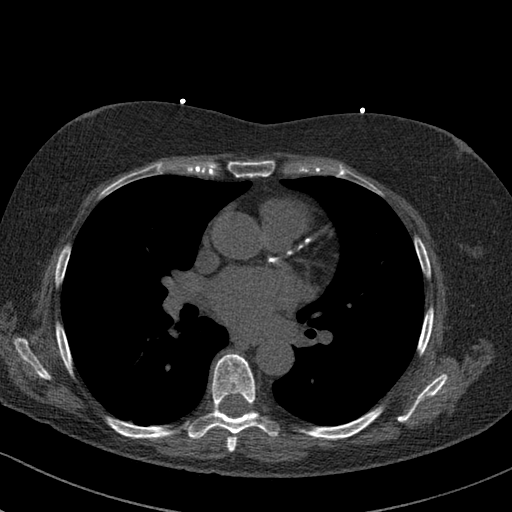
[im 56/68  vessel]
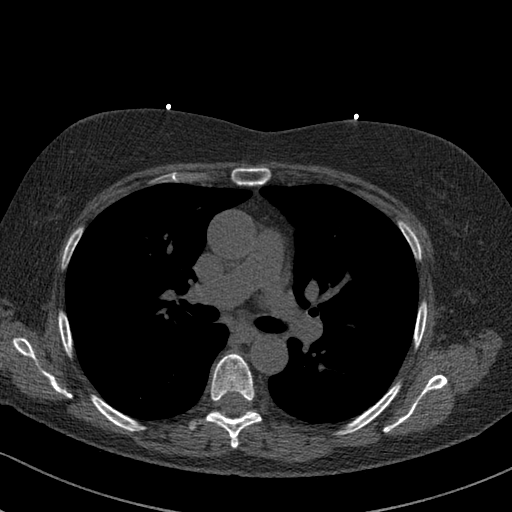
[im 56/68  lung]
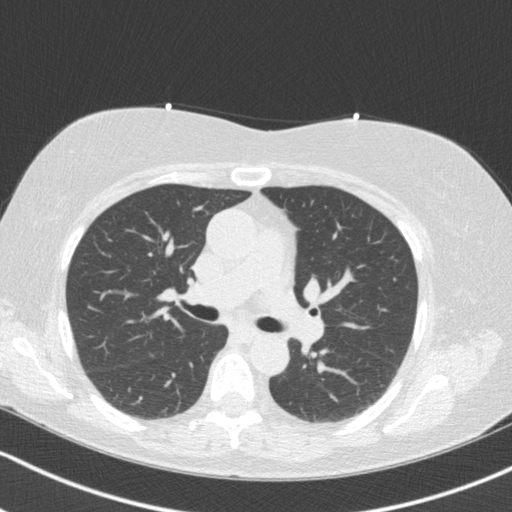

[Series 4: cascseq 2.0 br59 lung · axial · 0.64mm/px · z∈[-197,-109]mm · 5 of 68 slices shown]
[im 12/68  lung]
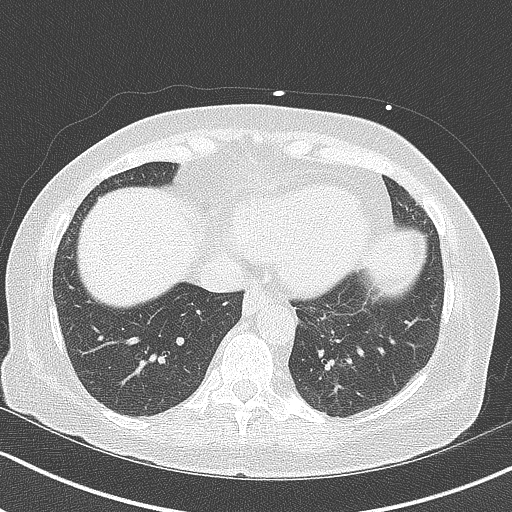
[im 23/68  lung]
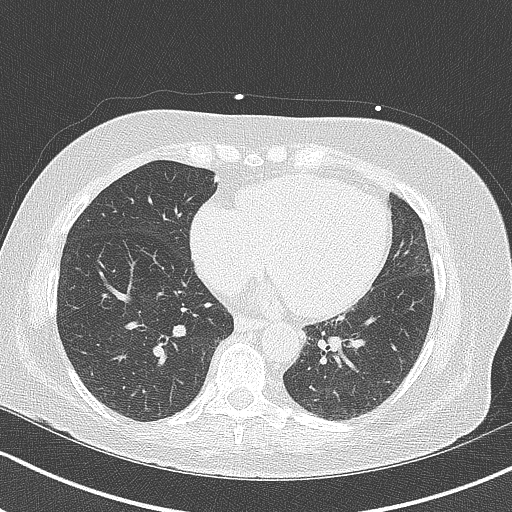
[im 34/68  lung]
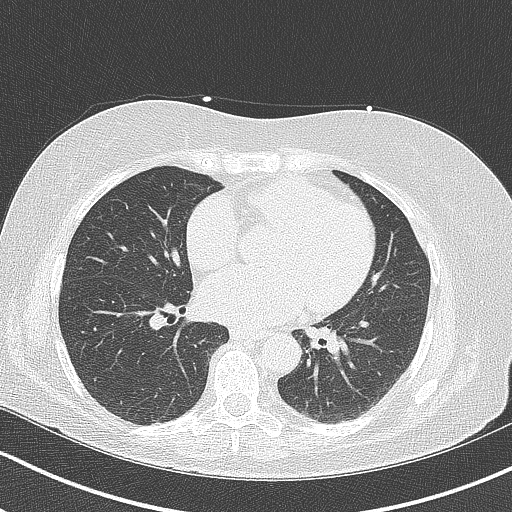
[im 45/68  lung]
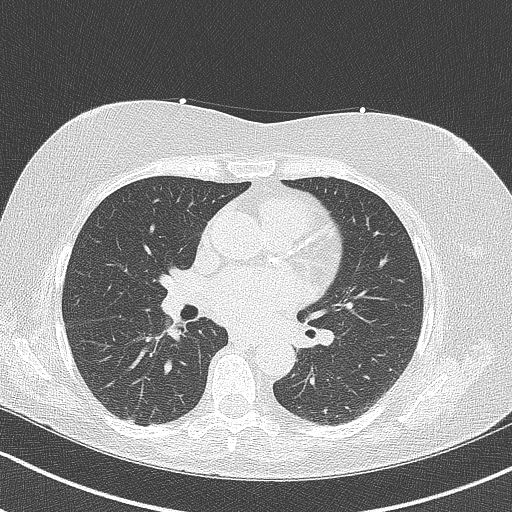
[im 56/68  lung]
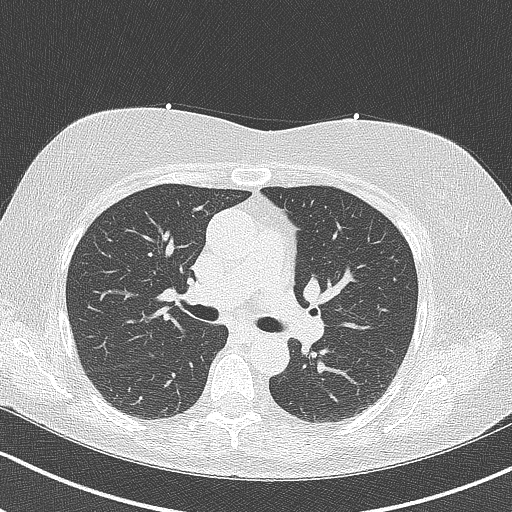

[14 of 20 positions shown; findings below may reference images not displayed]

FINDINGS: Vascular: Aortic atherosclerosis.

Mediastinum/Nodes: No imaged thoracic adenopathy.

Lungs/Pleura: No pleural fluid.  Clear imaged lungs.

Upper Abdomen: Normal imaged portions of the liver, spleen, stomach.

Musculoskeletal: No acute osseous abnormality.
IMPRESSION: No acute findings in the imaged extracardiac chest.

Aortic Atherosclerosis (Q265U-KZC.C).
FINDINGS: Coronary arteries: Normal origins.

Coronary Calcium Score:

Left main: 114

Left anterior descending artery: 124

Left circumflex artery:

Right coronary artery: 0

Total: 238

Percentile: 71

Pericardium: Normal.

Ascending Aorta: Normal caliber.  Aortic atherosclerosis.

Non-cardiac: See separate report from [REDACTED].
IMPRESSION: Coronary calcium score of 238. This was 71 percentile for age-,
race-, and sex-matched controls.

Aortic atherosclerosis.



If CAC=0, it is reasonable to withhold statin therapy and reassess
in 5 to 10 years, as long as higher risk conditions are absent
(diabetes mellitus, family history of premature CHD in first degree
relatives (males <55 years; females <65 years), cigarette smoking,
or LDL >=190 mg/dL).

If CAC is 1 to 99, it is reasonable to initiate statin therapy for
patients >=55 years of age.

If CAC is >=100 or >=75th percentile, it is reasonable to initiate
statin therapy at any age.

Cardiology referral should be considered for patients with CAC
scores >=400 or >=75th percentile.

*4756 AHA/ACC/AACVPR/AAPA/ABC/HAZE/ZOLANO/GIORGI/Karangcell/BAGD/RABBINSON/POMPILLA
Guideline on the Management of Blood Cholesterol: A Report of the
American College of Cardiology/American Heart Association Task Force
on Clinical Practice Guidelines. J Am Coll Cardiol.
8887;73(24):9680-9772.

*** End of Addendum ***
EXAM:
OVER-READ INTERPRETATION  CT CHEST

The following report is an over-read performed by radiologist Dr.
Shakay Chou [REDACTED] on 04/25/2021. This over-read
does not include interpretation of cardiac or coronary anatomy or
pathology. The calcium score interpretation by the cardiologist is
attached.
FINDINGS: Vascular: Aortic atherosclerosis.

Mediastinum/Nodes: No imaged thoracic adenopathy.

Lungs/Pleura: No pleural fluid.  Clear imaged lungs.

Upper Abdomen: Normal imaged portions of the liver, spleen, stomach.

Musculoskeletal: No acute osseous abnormality.
IMPRESSION: No acute findings in the imaged extracardiac chest.

Aortic Atherosclerosis (Q265U-KZC.C).

## 2021-05-20 ENCOUNTER — Encounter (INDEPENDENT_AMBULATORY_CARE_PROVIDER_SITE_OTHER): Payer: Self-pay | Admitting: Ophthalmology

## 2021-05-20 ENCOUNTER — Ambulatory Visit (INDEPENDENT_AMBULATORY_CARE_PROVIDER_SITE_OTHER): Payer: Medicare HMO | Admitting: Ophthalmology

## 2021-05-20 DIAGNOSIS — H353132 Nonexudative age-related macular degeneration, bilateral, intermediate dry stage: Secondary | ICD-10-CM | POA: Diagnosis not present

## 2021-05-20 DIAGNOSIS — H34832 Tributary (branch) retinal vein occlusion, left eye, with macular edema: Secondary | ICD-10-CM | POA: Diagnosis not present

## 2021-05-20 DIAGNOSIS — H2513 Age-related nuclear cataract, bilateral: Secondary | ICD-10-CM

## 2021-05-20 MED ORDER — BEVACIZUMAB 2.5 MG/0.1ML IZ SOSY
2.5000 mg | PREFILLED_SYRINGE | INTRAVITREAL | Status: AC | PRN
  Administered 2021-05-20: 2.5 mg via INTRAVITREAL

## 2021-05-20 NOTE — Progress Notes (Addendum)
? ? ?05/20/2021 ? ?  ? ?CHIEF COMPLAINT ?Patient presents for  ?Chief Complaint  ?Patient presents with  ? Branch Retinal Vein Occlusion  ? ? ?With stable acuity overall no interval change ? ?HISTORY OF PRESENT ILLNESS: ?Stacy Orozco is a 76 y.o. female who presents to the clinic today for:  ? ?HPI   ?6 weeks OS, Avastin OS, OCT. ?Patient states vision is stable and unchanged since last visit. Denies any new floaters or FOL. ? ?Last edited by Nelva Nay on 05/20/2021  9:40 AM.  ?  ? ? ?Referring physician: ?Sallye Lat, MD ?[address] ?[address] ?[place],  [place] [address] ? ?HISTORICAL INFORMATION:  ? ?Selected notes from the MEDICAL RECORD NUMBER ?  ?   ? ?CURRENT MEDICATIONS: ?No current outpatient medications on file. (Ophthalmic Drugs)  ? ?No current facility-administered medications for this visit. (Ophthalmic Drugs)  ? ?Current Outpatient Medications (Other)  ?Medication Sig  ? amitriptyline (ELAVIL) 10 MG tablet Take 10 mg by mouth at bedtime.  ? atorvastatin (LIPITOR) 20 MG tablet Take 20 mg by mouth daily.  ? calcium carbonate (OS-CAL) 600 MG TABS tablet Take 600 mg by mouth 2 (two) times daily with a meal.  ? citalopram (CELEXA) 20 MG tablet Take 1 tablet (20 mg total) by mouth daily.  ? cyclobenzaprine (FLEXERIL) 10 MG tablet Take 1 tablet (10 mg total) by mouth 2 (two) times daily as needed for muscle spasms.  ? glipiZIDE (GLUCOTROL XL) 5 MG 24 hr tablet Take 5 mg by mouth daily.  ? ibuprofen (ADVIL,MOTRIN) 200 MG tablet Take 200 mg by mouth every 6 (six) hours as needed for moderate pain.  ? losartan (COZAAR) 50 MG tablet Take 50 mg by mouth daily.  ? metFORMIN (GLUCOPHAGE-XR) 500 MG 24 hr tablet Take 500-1,000 mg by mouth 2 (two) times daily. 500 mg at supper & 1000 mg at bedtime  ? naproxen sodium (ALEVE) 220 MG tablet Take 220 mg by mouth daily as needed (pain).  ? vitamin E 400 UNIT capsule Take 400 Units by mouth daily.  ? ?No current facility-administered medications for this visit.  (Other)  ? ? ? ? ?REVIEW OF SYSTEMS: ?ROS   ?Negative for: Constitutional, Gastrointestinal, Neurological, Skin, Genitourinary, Musculoskeletal, HENT, Endocrine, Cardiovascular, Eyes, Respiratory, Psychiatric, Allergic/Imm, Heme/Lymph ?Last edited by Edmon Crape, MD on 05/20/2021 10:25 AM.  ?  ? ? ? ?ALLERGIES ?Allergies  ?Allergen Reactions  ? Penicillins Rash  ?  Has patient had a PCN reaction causing immediate rash, facial/tongue/throat swelling, SOB or lightheadedness with hypotension:  No (pt did experience minor rash)  ?Has patient had a PCN reaction causing severe rash involving mucus membranes or skin necrosis: no ?Has patient had a PCN reaction that required hospitalization: no ?Has patient had a PCN reaction occurring within the last 10 years: no ?If all of the above answers are "NO", then may proceed with Cephalosporin use. ?  ? ? ?PAST MEDICAL HISTORY ?Past Medical History:  ?Diagnosis Date  ? Depression   ? Diabetes mellitus without complication (HCC)   ? diet controlled  ? Hypercholesteremia   ? Hyperlipemia   ? ?Past Surgical History:  ?Procedure Laterality Date  ? TUBAL LIGATION    ? ? ?FAMILY HISTORY ?History reviewed. No pertinent family history. ? ?SOCIAL HISTORY ?Social History  ? ?Tobacco Use  ? Smoking status: Never  ?  Passive exposure: Never  ? Smokeless tobacco: Never  ?Substance Use Topics  ? Alcohol use: No  ?  Drug use: No  ? ?  ? ?  ? ?OPHTHALMIC EXAM: ? ?Base Eye Exam   ? ? Visual Acuity (ETDRS)   ? ?   Right Left  ? Dist [place] 20/50 -2 20/40  ? Dist ph [place] 20/25 20/30  ? ?  ?  ? ? Tonometry (Tonopen, 9:47 AM)   ? ?   Right Left  ? Pressure 16 17  ? ?  ?  ? ? Pupils   ? ?   Pupils APD  ? Right PERRL None  ? Left PERRL None  ? ?  ?  ? ? Visual Fields   ? ?   Left Right  ?  Full Full  ? ?  ?  ? ? Extraocular Movement   ? ?   Right Left  ?  Full, Ortho Full, Ortho  ? ?  ?  ? ? Neuro/Psych   ? ? Oriented x3: Yes  ? Mood/Affect: Normal  ? ?  ?  ? ? Dilation   ? ? Left eye: 1.0% Mydriacyl, 2.5%  Phenylephrine @ 9:47 AM  ? ?  ?  ? ?  ? ?Slit Lamp and Fundus Exam   ? ? External Exam   ? ?   Right Left  ? External Normal Normal  ? ?  ?  ? ? Slit Lamp Exam   ? ?   Right Left  ? Lids/Lashes Normal Normal  ? Conjunctiva/Sclera White and quiet White and quiet  ? Cornea Clear Clear  ? Anterior Chamber Deep and quiet Deep and quiet  ? Iris Round and reactive Round and reactive  ? Lens 2.5+ Nuclear sclerosis 2.5+ Nuclear sclerosis  ? Anterior Vitreous Normal Normal  ? ?  ?  ? ? Fundus Exam   ? ?   Right Left  ? Posterior Vitreous  Normal  ? Disc  Normal  ? C/D Ratio  0.2  ? Macula  Microaneurysms, Intraretinal hemorrhage macula, inferiorly much less CME OS  ? Vessels  Normal, no DR  ? Periphery  Normal  ? ?  ?  ? ?  ? ? ?IMAGING AND PROCEDURES  ?Imaging and Procedures for 05/20/21 ? ?Intravitreal Injection, Pharmacologic Agent - OS - Left Eye   ? ?   ?Time Out ?05/20/2021. 10:48 AM. Confirmed correct patient, procedure, site, and patient consented.  ? ?Anesthesia ?Topical anesthesia was used. Anesthetic medications included Lidocaine 4%.  ? ?Procedure ?Preparation included 10% betadine to eyelids, 5% betadine to ocular surface, Tobramycin 0.3%. A 30 gauge needle was used.  ? ?Injection: ?2.5 mg bevacizumab 2.5 MG/0.1ML ?  Route: Intravitreal, Site: Left Eye ?  NDC: 01779-390-30, Lot: [id number]  ? ?Post-op ?Post injection exam found visual acuity of at least counting fingers. The patient tolerated the procedure well. There were no complications. The patient received written and verbal post procedure care education. Post injection medications included ocuflox.  ? ?  ? ?OCT, Retina - OU - Both Eyes   ? ?   ?Right Eye ?Quality was good. Central Foveal Thickness: 298. Progression has been stable. Findings include retinal drusen , normal foveal contour.  ? ?Left Eye ?Quality was good. Scan locations included subfoveal. Central Foveal Thickness: 342. Progression has worsened. Findings include retinal drusen , cystoid macular  edema.  ? ?Notes ?OS with a history of macular branch retinal vein occlusion on the inferotemporal portion of the macula.  OS with worse CME at 8-week past but now improved at follow-up interval of 6  weeks post Avastin.  We will repeat injection today and maintain 6-week interval ? ?  ? ? ?  ?  ? ?  ?ASSESSMENT/PLAN: ? ?Nuclear sclerotic cataract of both eyes ?Bilateral cataract, follow-up Dr. Marchelle Gearinghris Groat as scheduled ? ?I did discuss with the patient she would likely have improved visual acuity should cataract surgery be contemplated and/or undertaken ? ?Intermediate stage nonexudative age-related macular degeneration of both eyes ?Minor OU  ? ?  ICD-10-CM   ?1. 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  OCT, Retina - OU - Both Eyes  ?  bevacizumab (AVASTIN) SOSY 2.5 mg  ?  ?2. Nuclear sclerotic cataract of both eyes  H25.13   ?  ?3. Intermediate stage nonexudative age-related macular degeneration of both eyes  H35.3132   ?  ? ? ?1.  Repeat injection intravitreal Avastin today is less CSME inferior to the FAZ.  We will continue to monitor at 6-week interval as at 8 weeks condition worsened ? ?2.  Okay to proceed with cataract evaluation and/or surgical intervention in either ? ?3. ? ?Ophthalmic Meds Ordered this visit:  ?Meds ordered this encounter  ?Medications  ? bevacizumab (AVASTIN) SOSY 2.5 mg  ? ? ?  ? ?Return in about 6 weeks (around 07/01/2021) for dilate, OS, AVASTIN OCT. ? ?There are no Patient Instructions on file for this visit. ? ? ?Explained the diagnoses, plan, and follow up with the patient and they expressed understanding.  Patient expressed understanding of the importance of proper follow up care.  ? ?Alford HighlandGary A. Ramey Ketcherside M.D. ?Diseases & Surgery of the Retina and Vitreous ?[facility] ?05/20/21 ? ? ? ? ?Abbreviations: ?M myopia (nearsighted); A astigmatism; H hyperopia (farsighted); P presbyopia;  Mrx spectacle prescription;  CTL contact lenses; OD right eye; OS left eye; OU both eyes  XT exotropia; ET esotropia; PEK punctate epithelial keratitis; PEE punctate epithelial erosions; DES dry eye syndrom

## 2021-05-20 NOTE — Assessment & Plan Note (Signed)
Minor OU 

## 2021-05-20 NOTE — Assessment & Plan Note (Signed)
Bilateral cataract, follow-up Dr. Midge Aver as scheduled ? ?I did discuss with the patient she would likely have improved visual acuity should cataract surgery be contemplated and/or undertaken ?

## 2021-05-21 ENCOUNTER — Other Ambulatory Visit: Payer: Self-pay

## 2021-05-21 DIAGNOSIS — R072 Precordial pain: Secondary | ICD-10-CM

## 2021-05-21 MED ORDER — METOPROLOL TARTRATE 100 MG PO TABS: 100.0000 mg | ORAL_TABLET | Freq: Once | ORAL | 0 refills | Status: AC

## 2021-06-05 ENCOUNTER — Ambulatory Visit (HOSPITAL_COMMUNITY): Admission: RE | Admit: 2021-06-05 | Payer: Medicare HMO | Source: Ambulatory Visit

## 2021-06-27 ENCOUNTER — Other Ambulatory Visit (HOSPITAL_COMMUNITY): Payer: Self-pay | Admitting: *Deleted

## 2021-06-27 ENCOUNTER — Telehealth (HOSPITAL_COMMUNITY): Payer: Self-pay | Admitting: *Deleted

## 2021-06-27 DIAGNOSIS — R072 Precordial pain: Secondary | ICD-10-CM

## 2021-06-27 NOTE — Telephone Encounter (Signed)
Reaching out to patient to offer assistance regarding upcoming cardiac imaging study; pt verbalizes understanding of appt date/time, but wishes to reschedule due to recent death in family.   New appointment made for June 27. She asked to be called the day before her new appointment.  She is aware to obtain labs prior and states she has her pill for the test.  Gordy Clement RN Navigator Cardiac Imaging [facility] [phone] office [phone] cell

## 2021-07-01 ENCOUNTER — Ambulatory Visit (INDEPENDENT_AMBULATORY_CARE_PROVIDER_SITE_OTHER): Payer: Medicare HMO | Admitting: Ophthalmology

## 2021-07-01 DIAGNOSIS — H2513 Age-related nuclear cataract, bilateral: Secondary | ICD-10-CM | POA: Diagnosis not present

## 2021-07-01 DIAGNOSIS — H34832 Tributary (branch) retinal vein occlusion, left eye, with macular edema: Secondary | ICD-10-CM

## 2021-07-01 MED ORDER — BEVACIZUMAB 2.5 MG/0.1ML IZ SOSY
2.5000 mg | PREFILLED_SYRINGE | INTRAVITREAL | Status: AC | PRN
  Administered 2021-07-01: 2.5 mg via INTRAVITREAL

## 2021-07-01 NOTE — Assessment & Plan Note (Signed)
Bilateral NSC changes OU.

## 2021-07-01 NOTE — Progress Notes (Signed)
07/01/2021     CHIEF COMPLAINT Patient presents for  Chief Complaint  Patient presents with   Branch Retinal Vein Occlusion      HISTORY OF PRESENT ILLNESS: Stacy Orozco is a 76 y.o. female who presents to the clinic today for:   HPI   6 weeks for DILATE. OS, AVASTIN OCT. Pt stated vision has been stable since last visit. Pt denies floaters and FOL.  Last edited by Angeline Slim on 07/01/2021  9:13 AM.      Referring physician: Ernesto Rutherford, MD [address] [place],  [place] [address]  HISTORICAL INFORMATION:   Selected notes from the MEDICAL RECORD NUMBER       CURRENT MEDICATIONS: No current outpatient medications on file. (Ophthalmic Drugs)   No current facility-administered medications for this visit. (Ophthalmic Drugs)   Current Outpatient Medications (Other)  Medication Sig   amitriptyline (ELAVIL) 10 MG tablet Take 10 mg by mouth at bedtime.   atorvastatin (LIPITOR) 20 MG tablet Take 20 mg by mouth daily.   calcium carbonate (OS-CAL) 600 MG TABS tablet Take 600 mg by mouth 2 (two) times daily with a meal.   citalopram (CELEXA) 20 MG tablet Take 1 tablet (20 mg total) by mouth daily.   cyclobenzaprine (FLEXERIL) 10 MG tablet Take 1 tablet (10 mg total) by mouth 2 (two) times daily as needed for muscle spasms.   glipiZIDE (GLUCOTROL XL) 5 MG 24 hr tablet Take 5 mg by mouth daily.   ibuprofen (ADVIL,MOTRIN) 200 MG tablet Take 200 mg by mouth every 6 (six) hours as needed for moderate pain.   losartan (COZAAR) 50 MG tablet Take 50 mg by mouth daily.   metFORMIN (GLUCOPHAGE-XR) 500 MG 24 hr tablet Take 500-1,000 mg by mouth 2 (two) times daily. 500 mg at supper & 1000 mg at bedtime   metoprolol tartrate (LOPRESSOR) 100 MG tablet Take 1 tablet (100 mg total) by mouth once for 1 dose. Take 2 hours prior to the procedure.   naproxen sodium (ALEVE) 220 MG tablet Take 220 mg by mouth daily as needed (pain).   vitamin E 400 UNIT capsule Take 400 Units by mouth daily.    No current facility-administered medications for this visit. (Other)      REVIEW OF SYSTEMS: ROS   Negative for: Constitutional, Gastrointestinal, Neurological, Skin, Genitourinary, Musculoskeletal, HENT, Endocrine, Cardiovascular, Eyes, Respiratory, Psychiatric, Allergic/Imm, Heme/Lymph Last edited by Angeline Slim on 07/01/2021  9:13 AM.       ALLERGIES Allergies  Allergen Reactions   Penicillins Rash    Has patient had a PCN reaction causing immediate rash, facial/tongue/throat swelling, SOB or lightheadedness with hypotension:  No (pt did experience minor rash)  Has patient had a PCN reaction causing severe rash involving mucus membranes or skin necrosis: no Has patient had a PCN reaction that required hospitalization: no Has patient had a PCN reaction occurring within the last 10 years: no If all of the above answers are "NO", then may proceed with Cephalosporin use.     PAST MEDICAL HISTORY Past Medical History:  Diagnosis Date   Depression    Diabetes mellitus without complication (HCC)    diet controlled   Hypercholesteremia    Hyperlipemia    Past Surgical History:  Procedure Laterality Date   TUBAL LIGATION      FAMILY HISTORY No family history on file.  SOCIAL HISTORY Social History   Tobacco Use   Smoking status: Never    Passive exposure:  Never   Smokeless tobacco: Never  Substance Use Topics   Alcohol use: No   Drug use: No         OPHTHALMIC EXAM:  Base Eye Exam     Visual Acuity (ETDRS)       Right Left   Dist [place] 20/80 20/80   Dist ph [place] 20/40 +1 20/70 -2         Tonometry (Tonopen, 9:20 AM)       Right Left   Pressure 18 21         Pupils       Pupils APD   Right PERRL None   Left PERRL None         Visual Fields       Left Right    Full Full         Extraocular Movement       Right Left    Full Full         Neuro/Psych     Oriented x3: Yes   Mood/Affect: Normal         Dilation     Left eye:  2.5% Phenylephrine, 1.0% Mydriacyl @ 9:20 AM           Slit Lamp and Fundus Exam     External Exam       Right Left   External Normal Normal         Slit Lamp Exam       Right Left   Lids/Lashes Normal Normal   Conjunctiva/Sclera White and quiet White and quiet   Cornea Clear Clear   Anterior Chamber Deep and quiet Deep and quiet   Iris Round and reactive Round and reactive   Lens 2.5+ Nuclear sclerosis 2.5+ Nuclear sclerosis   Anterior Vitreous Normal Normal         Fundus Exam       Right Left   Posterior Vitreous  Normal   Disc  Normal   C/D Ratio  0.2   Macula  Microaneurysms, Intraretinal hemorrhage macula, inferiorly much less CME OS   Vessels  Normal, no DR   Periphery  Normal            IMAGING AND PROCEDURES  Imaging and Procedures for 07/01/21  OCT, Retina - OU - Both Eyes       Right Eye Quality was good. Central Foveal Thickness: 301. Progression has been stable. Findings include retinal drusen , normal foveal contour.   Left Eye Quality was good. Scan locations included subfoveal. Central Foveal Thickness: 392. Progression has worsened. Findings include retinal drusen , cystoid macular edema.   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  OS with worse CME at 6-week today, we will repeat injection today and maintain 6-week interval, but need to change medications to Eylea OS     Intravitreal Injection, Pharmacologic Agent - OS - Left Eye       Time Out 07/01/2021. 10:14 AM. Confirmed correct patient, procedure, site, and patient consented.   Anesthesia Topical anesthesia was used. Anesthetic medications included Lidocaine 4%.   Procedure Preparation included 10% betadine to eyelids, 5% betadine to ocular surface, Tobramycin 0.3%. A 30 gauge needle was used.   Injection: 2.5 mg bevacizumab 2.5 MG/0.1ML   Route: Intravitreal, Site: Left Eye   NDC: [phone],  Lot: [id number], Expiration date: 08/25/2021   Post-op Post injection exam found visual acuity of at least  counting fingers. The patient tolerated the procedure well. There were no complications. The patient received written and verbal post procedure care education. Post injection medications included ocuflox.              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  We will need to repeat injection today to maintain and will need to change medications and follow-up in 6 weeks and will use Eylea next  Nuclear sclerotic cataract of both eyes Bilateral [place] changes OU.     ICD-10-CM   1. 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 Nuclear sclerotic cataract of both eyes  H25.13       1.  OS, macular BRVO, now resistant to Avastin use is increased CME at 6-week interval.  Repeat injection today to maintain and follow-up next in 5 to 6 weeks and change to Eylea OS  2.  3.  Ophthalmic Meds Ordered this visit:  Meds ordered this encounter  Medications   bevacizumab (AVASTIN) SOSY 2.5 mg       Return in about 6 weeks (around 08/12/2021) for dilate, OS, EYLEA OCT,,, this is a change.  There are no Patient Instructions on file for this visit.   Explained the diagnoses, plan, and follow up with the patient and they expressed understanding.  Patient expressed understanding of the importance of proper follow up care.   Clent Demark Leticia Coletta M.D. Diseases & Surgery of the Retina and Vitreous [facility] 07/01/21     Abbreviations: M myopia (nearsighted); A astigmatism; H hyperopia (farsighted); P presbyopia; Mrx spectacle prescription;   CTL contact lenses; OD right eye; OS left eye; OU both eyes  XT exotropia; ET esotropia; PEK punctate epithelial keratitis; PEE punctate epithelial erosions; DES dry eye syndrome; MGD meibomian gland dysfunction; ATs artificial tears; PFAT's preservative free artificial tears; [place] nuclear sclerotic cataract; PSC posterior subcapsular cataract; ERM epi-retinal membrane; PVD posterior vitreous detachment; RD retinal detachment; DM diabetes mellitus; DR diabetic retinopathy; NPDR non-proliferative diabetic retinopathy; PDR proliferative diabetic retinopathy; CSME clinically significant macular edema; DME diabetic macular edema; dbh dot blot hemorrhages; CWS cotton wool spot; POAG primary open angle glaucoma; C/D cup-to-disc ratio; HVF humphrey visual field; GVF goldmann visual field; OCT optical coherence tomography; IOP intraocular pressure; BRVO Branch retinal vein occlusion; CRVO central retinal vein occlusion; CRAO central retinal artery occlusion; BRAO branch retinal artery occlusion; RT retinal tear; SB scleral buckle; PPV pars plana vitrectomy; VH Vitreous hemorrhage; PRP panretinal laser photocoagulation; IVK intravitreal kenalog; VMT vitreomacular traction; MH Macular hole;  NVD neovascularization of the disc; NVE neovascularization elsewhere; AREDS age related eye disease study; ARMD age related macular degeneration; POAG primary open angle glaucoma; EBMD epithelial/anterior basement membrane dystrophy; ACIOL anterior chamber intraocular lens; IOL intraocular lens; PCIOL posterior chamber intraocular lens; Phaco/IOL phacoemulsification with intraocular lens placement; [place] photorefractive keratectomy; LASIK laser assisted in situ keratomileusis; HTN hypertension; DM diabetes mellitus; COPD chronic obstructive pulmonary disease

## 2021-07-01 NOTE — Assessment & Plan Note (Signed)
Macular branch retinal vein occlusion today follow-up at 6 weeks post injection Avastin with increase in CME inferior to the macular region.  We will need to repeat injection today to maintain and will need to change medications and follow-up in 6 weeks and will use Eylea next

## 2021-07-02 ENCOUNTER — Ambulatory Visit (HOSPITAL_COMMUNITY): Admission: RE | Admit: 2021-07-02 | Payer: Medicare HMO | Source: Ambulatory Visit

## 2021-07-10 LAB — BASIC METABOLIC PANEL
BUN/Creatinine Ratio: 28 (ref 12–28)
BUN: 18 mg/dL (ref 8–27)
CO2: 20 mmol/L (ref 20–29)
Calcium: 9.8 mg/dL (ref 8.7–10.3)
Chloride: 103 mmol/L (ref 96–106)
Creatinine, Ser: 0.64 mg/dL (ref 0.57–1.00)
Glucose: 172 mg/dL — ABNORMAL HIGH (ref 70–99)
Potassium: 4.8 mmol/L (ref 3.5–5.2)
Sodium: 139 mmol/L (ref 134–144)
eGFR: 91 mL/min/{1.73_m2} (ref >59)

## 2021-07-22 ENCOUNTER — Telehealth (HOSPITAL_COMMUNITY): Payer: Self-pay | Admitting: Emergency Medicine

## 2021-07-22 NOTE — Telephone Encounter (Signed)
Reaching out to patient to offer assistance regarding upcoming cardiac imaging study; pt verbalizes understanding of appt date/time, parking situation and where to check in, pre-test NPO status and medications ordered, and verified current allergies; name and call back number provided for further questions should they arise Rockwell Alexandria RN Navigator Cardiac Imaging [facility] [phone] office [phone] cell  100mg  metoprolol  Denies iv issues  Arrival 1100

## 2021-07-23 ENCOUNTER — Ambulatory Visit (HOSPITAL_COMMUNITY)
Admission: RE | Admit: 2021-07-23 | Discharge: 2021-07-23 | Disposition: A | Discharge status: Home / Self Care | Payer: Medicare HMO | Source: Ambulatory Visit | Attending: Cardiovascular Disease | Admitting: Cardiovascular Disease

## 2021-07-23 ENCOUNTER — Ambulatory Visit (HOSPITAL_BASED_OUTPATIENT_CLINIC_OR_DEPARTMENT_OTHER)
Admission: RE | Admit: 2021-07-23 | Discharge: 2021-07-23 | Disposition: A | Discharge status: Home / Self Care | Payer: Medicare HMO | Source: Ambulatory Visit | Attending: Cardiovascular Disease | Admitting: Cardiovascular Disease

## 2021-07-23 ENCOUNTER — Other Ambulatory Visit: Payer: Self-pay | Admitting: Cardiovascular Disease

## 2021-07-23 ENCOUNTER — Encounter (HOSPITAL_COMMUNITY): Payer: Self-pay

## 2021-07-23 DIAGNOSIS — R072 Precordial pain: Secondary | ICD-10-CM | POA: Insufficient documentation

## 2021-07-23 DIAGNOSIS — I2510 Atherosclerotic heart disease of native coronary artery without angina pectoris: Secondary | ICD-10-CM | POA: Diagnosis not present

## 2021-07-23 DIAGNOSIS — R931 Abnormal findings on diagnostic imaging of heart and coronary circulation: Secondary | ICD-10-CM

## 2021-07-23 MED ORDER — METOPROLOL TARTRATE 5 MG/5ML IV SOLN
INTRAVENOUS | Status: AC
  Filled 2021-07-23: qty 5

## 2021-07-23 MED ORDER — IOHEXOL 350 MG/ML SOLN
100.0000 mL | Freq: Once | INTRAVENOUS | Status: AC | PRN
  Administered 2021-07-23: 100 mL via INTRAVENOUS

## 2021-07-23 MED ORDER — METOPROLOL TARTRATE 5 MG/5ML IV SOLN
5.0000 mg | INTRAVENOUS | Status: DC | PRN
  Administered 2021-07-23: 5 mg via INTRAVENOUS

## 2021-07-23 MED ORDER — NITROGLYCERIN 0.4 MG SL SUBL
0.8000 mg | SUBLINGUAL_TABLET | Freq: Once | SUBLINGUAL | Status: AC
  Administered 2021-07-23: 0.8 mg via SUBLINGUAL

## 2021-07-23 MED ORDER — NITROGLYCERIN 0.4 MG SL SUBL
SUBLINGUAL_TABLET | SUBLINGUAL | Status: AC
  Filled 2021-07-23: qty 2

## 2021-07-23 NOTE — Progress Notes (Signed)
CT scan completed. Tolerated well. D/C home ambulatory, awake and alert. In no distress. 

## 2021-08-12 ENCOUNTER — Encounter (INDEPENDENT_AMBULATORY_CARE_PROVIDER_SITE_OTHER): Payer: Medicare HMO | Admitting: Ophthalmology

## 2021-08-14 ENCOUNTER — Ambulatory Visit (INDEPENDENT_AMBULATORY_CARE_PROVIDER_SITE_OTHER): Payer: Medicare HMO | Admitting: Ophthalmology

## 2021-08-14 ENCOUNTER — Encounter (INDEPENDENT_AMBULATORY_CARE_PROVIDER_SITE_OTHER): Payer: Self-pay | Admitting: Ophthalmology

## 2021-08-14 DIAGNOSIS — H2513 Age-related nuclear cataract, bilateral: Secondary | ICD-10-CM | POA: Diagnosis not present

## 2021-08-14 DIAGNOSIS — H353132 Nonexudative age-related macular degeneration, bilateral, intermediate dry stage: Secondary | ICD-10-CM

## 2021-08-14 DIAGNOSIS — H34832 Tributary (branch) retinal vein occlusion, left eye, with macular edema: Secondary | ICD-10-CM | POA: Diagnosis not present

## 2021-08-14 MED ORDER — AFLIBERCEPT 2MG/0.05ML IZ SOLN FOR KALEIDOSCOPE
2.0000 mg | INTRAVITREAL | Status: AC | PRN
  Administered 2021-08-14: 2 mg via INTRAVITREAL

## 2021-08-14 NOTE — Assessment & Plan Note (Signed)
No sign of CNVM 

## 2021-08-14 NOTE — Assessment & Plan Note (Signed)
OS, proven resistance to Avastin.  Changed intravitreal Eylea OS today follow-up again in 5 to 6 weeks

## 2021-08-14 NOTE — Progress Notes (Signed)
08/14/2021     CHIEF COMPLAINT Patient presents for  Chief Complaint  Patient presents with   Branch Retinal Vein Occlusion      HISTORY OF PRESENT ILLNESS: Stacy Orozco is a 76 y.o. female who presents to the clinic today for:   HPI   6 weeks for DILATE OS, EYLEA, OCT. For ongoing CME secondary to BRVO Pt stated vision has been stable since last visit.   Last edited by Edmon Crape, MD on 08/14/2021 10:17 AM.      Referring physician: Sallye Lat, MD [address] [place],  [place] [address]  HISTORICAL INFORMATION:   Selected notes from the MEDICAL RECORD NUMBER       CURRENT MEDICATIONS: No current outpatient medications on file. (Ophthalmic Drugs)   No current facility-administered medications for this visit. (Ophthalmic Drugs)   Current Outpatient Medications (Other)  Medication Sig   amitriptyline (ELAVIL) 10 MG tablet Take 10 mg by mouth at bedtime.   atorvastatin (LIPITOR) 20 MG tablet Take 20 mg by mouth daily.   calcium carbonate (OS-CAL) 600 MG TABS tablet Take 600 mg by mouth 2 (two) times daily with a meal. (Patient not taking: Reported on 07/23/2021)   citalopram (CELEXA) 20 MG tablet Take 1 tablet (20 mg total) by mouth daily.   cyclobenzaprine (FLEXERIL) 10 MG tablet Take 1 tablet (10 mg total) by mouth 2 (two) times daily as needed for muscle spasms. (Patient not taking: Reported on 07/23/2021)   glipiZIDE (GLUCOTROL XL) 5 MG 24 hr tablet Take 5 mg by mouth daily.   ibuprofen (ADVIL,MOTRIN) 200 MG tablet Take 200 mg by mouth every 6 (six) hours as needed for moderate pain.   losartan (COZAAR) 50 MG tablet Take 50 mg by mouth daily.   metFORMIN (GLUCOPHAGE-XR) 500 MG 24 hr tablet Take 500-1,000 mg by mouth 2 (two) times daily. 500 mg at supper & 1000 mg at bedtime   metoprolol tartrate (LOPRESSOR) 100 MG tablet Take 1 tablet (100 mg total) by mouth once for 1 dose. Take 2 hours prior to the procedure.   naproxen sodium (ALEVE) 220 MG  tablet Take 220 mg by mouth daily as needed (pain).   vitamin E 400 UNIT capsule Take 400 Units by mouth daily.   No current facility-administered medications for this visit. (Other)      REVIEW OF SYSTEMS: ROS   Negative for: Constitutional, Gastrointestinal, Neurological, Skin, Genitourinary, Musculoskeletal, HENT, Endocrine, Cardiovascular, Eyes, Respiratory, Psychiatric, Allergic/Imm, Heme/Lymph Last edited by Angeline Slim on 08/14/2021  9:53 AM.       ALLERGIES Allergies  Allergen Reactions   Penicillins Rash    Has patient had a PCN reaction causing immediate rash, facial/tongue/throat swelling, SOB or lightheadedness with hypotension:  No (pt did experience minor rash)  Has patient had a PCN reaction causing severe rash involving mucus membranes or skin necrosis: no Has patient had a PCN reaction that required hospitalization: no Has patient had a PCN reaction occurring within the last 10 years: no If all of the above answers are "NO", then may proceed with Cephalosporin use.     PAST MEDICAL HISTORY Past Medical History:  Diagnosis Date   Depression    Diabetes mellitus without complication (HCC)    diet controlled   Hypercholesteremia    Hyperlipemia    Past Surgical History:  Procedure Laterality Date   TUBAL LIGATION      FAMILY HISTORY History reviewed. No pertinent family history.  SOCIAL HISTORY  Social History   Tobacco Use   Smoking status: Never    Passive exposure: Never   Smokeless tobacco: Never  Substance Use Topics   Alcohol use: No   Drug use: No         OPHTHALMIC EXAM:  Base Eye Exam     Visual Acuity (ETDRS)       Right Left   Dist [place] 20/150 20/70 -1   Dist ph [place] 20/50 +2 20/40 -2         Tonometry (Tonopen, 9:58 AM)       Right Left   Pressure 18 15         Pupils       Pupils APD   Right PERRL None   Left PERRL None         Visual Fields       Left Right    Full Full         Extraocular Movement        Right Left    Full Full         Neuro/Psych     Oriented x3: Yes   Mood/Affect: Normal         Dilation     Left eye: 2.5% Phenylephrine, 1.0% Mydriacyl @ 9:58 AM           Slit Lamp and Fundus Exam     External Exam       Right Left   External Normal Normal         Slit Lamp Exam       Right Left   Lids/Lashes Normal Normal   Conjunctiva/Sclera White and quiet White and quiet   Cornea Clear Clear   Anterior Chamber Deep and quiet Deep and quiet   Iris Round and reactive Round and reactive   Lens 2.5+ Nuclear sclerosis 2.5+ Nuclear sclerosis   Anterior Vitreous Normal Normal         Fundus Exam       Right Left   Posterior Vitreous  Normal   Disc  Normal   C/D Ratio  0.2   Macula  Microaneurysms, Intraretinal hemorrhage macula, inferiorly much less CME OS   Vessels  Normal, no DR   Periphery  Normal            IMAGING AND PROCEDURES  Imaging and Procedures for 08/14/21  OCT, Retina - OU - Both Eyes       Right Eye Quality was good. Central Foveal Thickness: 300. Progression has been stable. Findings include normal foveal contour, retinal drusen .   Left Eye Quality was good. Scan locations included subfoveal. Central Foveal Thickness: 399. Progression has worsened. Findings include abnormal foveal contour, retinal drusen , cystoid macular edema.   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  OS with worse CME at 6-week today, we will repeat injection today change to Eylea OS and maintain 6-week interval,     Intravitreal Injection, Pharmacologic Agent - OS - Left Eye       Time Out 08/14/2021. 10:19 AM. Confirmed correct patient, procedure, site, and patient consented.   Anesthesia Topical anesthesia was used. Anesthetic medications included Lidocaine 4%.   Procedure Preparation included 5% betadine to ocular surface, 10% betadine to eyelids, Tobramycin  0.3%. A 30 gauge needle was used.   Injection: 2 mg aflibercept 2 MG/0.05ML   Route: Intravitreal, Site: Left Eye   NDC: [id number], Lot:  [id number], Expiration date: 01/27/2022, Waste: 0 mL   Post-op Post injection exam found visual acuity of at least counting fingers. The patient tolerated the procedure well. There were no complications. The patient received written and verbal post procedure care education. Post injection medications included ocuflox.              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  Changed intravitreal Eylea OS today follow-up again in 5 to 6 weeks     ICD-10-CM   1. 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 Intermediate stage nonexudative age-related macular degeneration of both eyes  H35.3132     3. Nuclear sclerotic cataract of both eyes  H25.13       1.  OS with persistent center involved CSME proven resistance now on Avastin x3.  We will repeat injection and therapy today yet need to change to intravitreal Eylea.  2.  Okay to proceed with cataract surgery left eye at any time during the course of ongoing therapy  3.  Follow-up Dr. Marchelle Gearing as scheduled, okay of course for cataract surgery in the right eye at any time  Ophthalmic Meds Ordered this visit:  Meds ordered this encounter  Medications   aflibercept (EYLEA) SOLN 2 mg       Return in about 6 weeks (around 09/25/2021) for DILATE OU, EYLEA OCT, OS.  There are no  Patient Instructions on file for this visit.   Explained the diagnoses, plan, and follow up with the patient and they expressed understanding.  Patient expressed understanding of the importance of proper follow up care.   Alford Highland Chelsae Zanella M.D. Diseases & Surgery of the Retina and Vitreous [facility] 08/14/21     Abbreviations: M myopia (nearsighted); A astigmatism; H hyperopia (farsighted); P presbyopia; Mrx spectacle prescription;  CTL contact lenses; OD right eye; OS left eye; OU both eyes  XT exotropia; ET esotropia; PEK punctate epithelial keratitis; PEE punctate epithelial erosions; DES dry eye syndrome; MGD meibomian gland dysfunction; ATs artificial tears; PFAT's preservative free artificial tears; NSC nuclear sclerotic cataract; PSC posterior subcapsular cataract; ERM epi-retinal membrane; PVD posterior vitreous detachment; RD retinal detachment; DM diabetes mellitus; DR diabetic retinopathy; NPDR non-proliferative diabetic retinopathy; PDR proliferative diabetic retinopathy; CSME clinically significant macular edema; DME diabetic macular edema; dbh dot blot hemorrhages; CWS cotton wool spot; POAG primary open angle glaucoma; C/D cup-to-disc ratio; HVF humphrey visual field; GVF goldmann visual field; OCT optical coherence tomography; IOP intraocular pressure; BRVO Branch retinal vein occlusion; CRVO central retinal vein occlusion; CRAO central retinal artery occlusion; BRAO branch retinal artery occlusion; RT retinal tear; SB scleral buckle; PPV pars plana vitrectomy; VH Vitreous hemorrhage; PRP panretinal laser photocoagulation; IVK intravitreal kenalog; VMT vitreomacular traction; MH Macular hole;  NVD neovascularization of the disc; NVE neovascularization elsewhere; AREDS age related eye disease study; ARMD age related macular degeneration; POAG primary open angle glaucoma; EBMD epithelial/anterior basement membrane dystrophy; ACIOL  anterior chamber intraocular lens; IOL  intraocular lens; PCIOL posterior chamber intraocular lens; Phaco/IOL phacoemulsification with intraocular lens placement; PRK photorefractive keratectomy; LASIK laser assisted in situ keratomileusis; HTN hypertension; DM diabetes mellitus; COPD chronic obstructive pulmonary disease

## 2021-08-14 NOTE — Assessment & Plan Note (Addendum)
Planning for cataract surgery in the right eye soon and may proceed with the left eye at any time during the course of therapy for the macular BVO  OS

## 2021-09-25 ENCOUNTER — Encounter (INDEPENDENT_AMBULATORY_CARE_PROVIDER_SITE_OTHER): Payer: Self-pay | Admitting: Ophthalmology

## 2021-09-25 ENCOUNTER — Ambulatory Visit (INDEPENDENT_AMBULATORY_CARE_PROVIDER_SITE_OTHER): Payer: Medicare HMO | Admitting: Ophthalmology

## 2021-09-25 DIAGNOSIS — Z961 Presence of intraocular lens: Secondary | ICD-10-CM

## 2021-09-25 DIAGNOSIS — H34832 Tributary (branch) retinal vein occlusion, left eye, with macular edema: Secondary | ICD-10-CM | POA: Diagnosis not present

## 2021-09-25 MED ORDER — AFLIBERCEPT 2MG/0.05ML IZ SOLN FOR KALEIDOSCOPE
2.0000 mg | INTRAVITREAL | Status: AC | PRN
  Administered 2021-09-25: 2 mg via INTRAVITREAL

## 2021-09-25 NOTE — Progress Notes (Signed)
09/25/2021     CHIEF COMPLAINT Patient presents for No chief complaint on file.     HISTORY OF PRESENT ILLNESS: Stacy Orozco is a 76 y.o. female who presents to the clinic today for:   HPI   5-6 week dilate ou eylea oct os Pt states her vision has been stable Pt admits to floaters and FOL in the right eye. Pt has had her cataracts removed in both eyes. Last edited by Aleene Davidson, CMA on 09/25/2021 10:04 AM.      Referring physician: Sallye Lat, MD [address] [place],  [place] [address]  HISTORICAL INFORMATION:   Selected notes from the MEDICAL RECORD NUMBER       CURRENT MEDICATIONS: No current outpatient medications on file. (Ophthalmic Drugs)   No current facility-administered medications for this visit. (Ophthalmic Drugs)   Current Outpatient Medications (Other)  Medication Sig   amitriptyline (ELAVIL) 10 MG tablet Take 10 mg by mouth at bedtime.   atorvastatin (LIPITOR) 20 MG tablet Take 20 mg by mouth daily.   calcium carbonate (OS-CAL) 600 MG TABS tablet Take 600 mg by mouth 2 (two) times daily with a meal. (Patient not taking: Reported on 07/23/2021)   citalopram (CELEXA) 20 MG tablet Take 1 tablet (20 mg total) by mouth daily.   cyclobenzaprine (FLEXERIL) 10 MG tablet Take 1 tablet (10 mg total) by mouth 2 (two) times daily as needed for muscle spasms. (Patient not taking: Reported on 07/23/2021)   glipiZIDE (GLUCOTROL XL) 5 MG 24 hr tablet Take 5 mg by mouth daily.   ibuprofen (ADVIL,MOTRIN) 200 MG tablet Take 200 mg by mouth every 6 (six) hours as needed for moderate pain.   losartan (COZAAR) 50 MG tablet Take 50 mg by mouth daily.   metFORMIN (GLUCOPHAGE-XR) 500 MG 24 hr tablet Take 500-1,000 mg by mouth 2 (two) times daily. 500 mg at supper & 1000 mg at bedtime   metoprolol tartrate (LOPRESSOR) 100 MG tablet Take 1 tablet (100 mg total) by mouth once for 1 dose. Take 2 hours prior to the procedure.   naproxen sodium (ALEVE) 220 MG  tablet Take 220 mg by mouth daily as needed (pain).   vitamin E 400 UNIT capsule Take 400 Units by mouth daily.   No current facility-administered medications for this visit. (Other)      REVIEW OF SYSTEMS: ROS   Negative for: Constitutional, Gastrointestinal, Neurological, Skin, Genitourinary, Musculoskeletal, HENT, Endocrine, Cardiovascular, Eyes, Respiratory, Psychiatric, Allergic/Imm, Heme/Lymph Last edited by Aleene Davidson, CMA on 09/25/2021 10:04 AM.       ALLERGIES Allergies  Allergen Reactions   Penicillins Rash    Has patient had a PCN reaction causing immediate rash, facial/tongue/throat swelling, SOB or lightheadedness with hypotension:  No (pt did experience minor rash)  Has patient had a PCN reaction causing severe rash involving mucus membranes or skin necrosis: no Has patient had a PCN reaction that required hospitalization: no Has patient had a PCN reaction occurring within the last 10 years: no If all of the above answers are "NO", then may proceed with Cephalosporin use.     PAST MEDICAL HISTORY Past Medical History:  Diagnosis Date   Depression    Diabetes mellitus without complication (HCC)    diet controlled   Hypercholesteremia    Hyperlipemia    Nuclear sclerotic cataract of both eyes 02/21/2021   Past Surgical History:  Procedure Laterality Date   TUBAL LIGATION      FAMILY  HISTORY History reviewed. No pertinent family history.  SOCIAL HISTORY Social History   Tobacco Use   Smoking status: Never    Passive exposure: Never   Smokeless tobacco: Never  Substance Use Topics   Alcohol use: No   Drug use: No         OPHTHALMIC EXAM:  Base Eye Exam     Visual Acuity (ETDRS)       Right Left   Dist [place] 20/25 20/25         Tonometry (Tonopen, 10:10 AM)       Right Left   Pressure 17 16         Pupils       Pupils   Right PERRL   Left PERRL         Extraocular Movement       Right Left    Ortho Ortho    -- --  --  --  --  -- -- --   -- -- --  --  --  -- -- --           Neuro/Psych     Oriented x3: Yes   Mood/Affect: Normal         Dilation     Both eyes: 1.0% Mydriacyl, 2.5% Phenylephrine @ 10:05 AM           Slit Lamp and Fundus Exam     External Exam       Right Left   External Normal Normal         Slit Lamp Exam       Right Left   Lids/Lashes Normal Normal   Conjunctiva/Sclera White and quiet White and quiet   Cornea Clear Clear   Anterior Chamber Deep and quiet Deep and quiet   Iris Round and reactive Round and reactive   Lens Centered posterior chamber intraocular lens Centered posterior chamber intraocular lens   Anterior Vitreous Normal Normal         Fundus Exam       Right Left   Posterior Vitreous  Normal   Disc  Normal   C/D Ratio  0.2   Macula  Microaneurysms, Intraretinal hemorrhage macula, inferiorly much less CME OS   Vessels  Normal, no DR   Periphery  Normal            IMAGING AND PROCEDURES  Imaging and Procedures for 09/25/21  OCT, Retina - OU - Both Eyes       Right Eye Quality was good. Scan locations included subfoveal. Central Foveal Thickness: 307. Progression has been stable. Findings include normal foveal contour, retinal drusen .   Left Eye Quality was good. Scan locations included subfoveal. Central Foveal Thickness: 317. Progression has worsened. Findings include abnormal foveal contour, retinal drusen .   Notes OS much improved macular edema after recent therapy onset, Intravitreal Eylea for branch retinal vein occlusion t CME OS     Intravitreal Injection, Pharmacologic Agent - OS - Left Eye       Time Out 09/25/2021. 10:53 AM. Confirmed correct patient, procedure, site, and patient consented.   Anesthesia Topical anesthesia was used. Anesthetic medications included Lidocaine 4%.   Procedure Preparation included 5% betadine to ocular surface, 10% betadine to eyelids, Tobramycin 0.3%. A 30 gauge  needle was used.   Injection: 2 mg aflibercept 2 MG/0.05ML   Route: Intravitreal, Site: Left Eye   NDC: [id number], Lot: [id number], Expiration date: 04/28/2022, Waste: 0 mL   Post-op Post  injection exam found visual acuity of at least counting fingers. The patient tolerated the procedure well. There were no complications. The patient received written and verbal post procedure care education. Post injection medications included ocuflox.              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  Pseudophakia of both eyes Bilateral look great OU     ICD-10-CM   1. 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 Pseudophakia of both eyes  Z96.1       1.  2.  3.  Ophthalmic Meds Ordered this visit:  Meds ordered this encounter  Medications   aflibercept (EYLEA) SOLN 2 mg       Return in about 9 weeks (around 11/27/2021) for dilate, OS, EYLEA OCT.  There are no Patient Instructions on file for this visit.   Explained the diagnoses, plan, and follow up with the patient and they expressed understanding.  Patient expressed understanding of the importance of proper follow up care.   Alford Highland Ferguson Gertner M.D. Diseases & Surgery of the Retina and Vitreous [facility] 09/25/21     Abbreviations: M myopia (nearsighted); A astigmatism; H hyperopia (farsighted); P presbyopia; Mrx spectacle prescription;  CTL contact lenses; OD right eye; OS left eye; OU both eyes  XT exotropia; ET esotropia; PEK punctate epithelial keratitis; PEE punctate epithelial erosions; DES dry eye syndrome; MGD meibomian gland dysfunction; ATs artificial tears;  PFAT's preservative free artificial tears; NSC nuclear sclerotic cataract; PSC posterior subcapsular cataract; ERM epi-retinal membrane; PVD posterior vitreous detachment; RD retinal detachment; DM diabetes mellitus; DR diabetic retinopathy; NPDR non-proliferative diabetic retinopathy; PDR proliferative diabetic retinopathy; CSME clinically significant macular edema; DME diabetic macular edema; dbh dot blot hemorrhages; CWS cotton wool spot; POAG primary open angle glaucoma; C/D cup-to-disc ratio; HVF humphrey visual field; GVF goldmann visual field; OCT optical coherence tomography; IOP intraocular pressure; BRVO Branch retinal vein occlusion; CRVO central retinal vein occlusion; CRAO central retinal artery occlusion; BRAO branch retinal artery occlusion; RT retinal tear; SB scleral buckle; PPV pars plana vitrectomy; VH Vitreous hemorrhage; PRP panretinal laser photocoagulation; IVK intravitreal kenalog; VMT vitreomacular traction; MH Macular hole;  NVD neovascularization of the disc; NVE neovascularization elsewhere; AREDS age related eye disease study; ARMD age related macular degeneration; POAG primary open angle glaucoma; EBMD epithelial/anterior basement membrane dystrophy; ACIOL anterior chamber intraocular lens; IOL intraocular lens; PCIOL posterior chamber intraocular lens; Phaco/IOL phacoemulsification with intraocular lens placement; PRK photorefractive keratectomy; LASIK laser assisted in situ keratomileusis; HTN hypertension; DM diabetes mellitus; COPD chronic obstructive pulmonary disease

## 2021-09-25 NOTE — Assessment & Plan Note (Signed)
Vastly improved post recent an eye vegF injection, will repeat Eylea OS today and extend interval examination to next to 9 to 10 weeks.

## 2021-09-25 NOTE — Assessment & Plan Note (Signed)
Bilateral look great OU

## 2021-11-27 ENCOUNTER — Encounter (INDEPENDENT_AMBULATORY_CARE_PROVIDER_SITE_OTHER): Payer: Medicare HMO | Admitting: Ophthalmology

## 2022-04-29 ENCOUNTER — Telehealth: Payer: Self-pay | Admitting: Cardiovascular Disease

## 2022-04-29 NOTE — Telephone Encounter (Signed)
Spoke with pt regarding doing another coronary calcium score. Advised pt that we do not typically redo these on an annual basis. Explained that we were able to get additional information regarding her heart arteries from coronary CTA. Explained that there is some non-obstructive plaque in her coronary arteries. Explained that now the focus would be risk factor modification, including keeping cholesterol levels down, heart healthy diet and exercise. Pt would like more information about a heart healthy diet. Will print off resources and mail to pt's home address.

## 2022-04-29 NOTE — Telephone Encounter (Signed)
Patient calling to see if she needs another CT cardiac scoring this year

## 2022-06-05 ENCOUNTER — Other Ambulatory Visit: Payer: Self-pay

## 2022-06-05 ENCOUNTER — Emergency Department (HOSPITAL_BASED_OUTPATIENT_CLINIC_OR_DEPARTMENT_OTHER)
Admission: EM | Admit: 2022-06-05 | Discharge: 2022-06-05 | Disposition: A | Discharge status: Home / Self Care | Payer: Medicare HMO | Attending: Emergency Medicine | Admitting: Emergency Medicine

## 2022-06-05 ENCOUNTER — Emergency Department (HOSPITAL_BASED_OUTPATIENT_CLINIC_OR_DEPARTMENT_OTHER): Payer: Medicare HMO

## 2022-06-05 ENCOUNTER — Encounter (HOSPITAL_BASED_OUTPATIENT_CLINIC_OR_DEPARTMENT_OTHER): Payer: Self-pay | Admitting: Emergency Medicine

## 2022-06-05 DIAGNOSIS — I6782 Cerebral ischemia: Secondary | ICD-10-CM | POA: Insufficient documentation

## 2022-06-05 DIAGNOSIS — H81399 Other peripheral vertigo, unspecified ear: Secondary | ICD-10-CM | POA: Insufficient documentation

## 2022-06-05 DIAGNOSIS — H5500 Unspecified nystagmus: Secondary | ICD-10-CM | POA: Diagnosis not present

## 2022-06-05 DIAGNOSIS — R519 Headache, unspecified: Secondary | ICD-10-CM | POA: Diagnosis not present

## 2022-06-05 DIAGNOSIS — R42 Dizziness and giddiness: Secondary | ICD-10-CM | POA: Diagnosis present

## 2022-06-05 LAB — DIFFERENTIAL
Abs Immature Granulocytes: 0.03 10*3/uL (ref 0.00–0.07)
Basophils Absolute: 0 10*3/uL (ref 0.0–0.1)
Basophils Relative: 0 %
Eosinophils Absolute: 0.2 10*3/uL (ref 0.0–0.5)
Eosinophils Relative: 2 %
Immature Granulocytes: 0 %
Lymphocytes Relative: 20 %
Lymphs Abs: 1.7 10*3/uL (ref 0.7–4.0)
Monocytes Absolute: 0.4 10*3/uL (ref 0.1–1.0)
Monocytes Relative: 5 %
Neutro Abs: 6 10*3/uL (ref 1.7–7.7)
Neutrophils Relative %: 73 %

## 2022-06-05 LAB — COMPREHENSIVE METABOLIC PANEL
ALT: 8 U/L (ref 0–44)
AST: 12 U/L — ABNORMAL LOW (ref 15–41)
Albumin: 4.4 g/dL (ref 3.5–5.0)
Alkaline Phosphatase: 53 U/L (ref 38–126)
Anion gap: 9 (ref 5–15)
BUN: 22 mg/dL (ref 8–23)
CO2: 27 mmol/L (ref 22–32)
Calcium: 10 mg/dL (ref 8.9–10.3)
Chloride: 102 mmol/L (ref 98–111)
Creatinine, Ser: 0.79 mg/dL (ref 0.44–1.00)
GFR, Estimated: >60 mL/min (ref >60)
Glucose, Bld: 171 mg/dL — ABNORMAL HIGH (ref 70–99)
Potassium: 4.5 mmol/L (ref 3.5–5.1)
Sodium: 138 mmol/L (ref 135–145)
Total Bilirubin: 0.4 mg/dL (ref 0.3–1.2)
Total Protein: 6.7 g/dL (ref 6.5–8.1)

## 2022-06-05 LAB — CBC
HCT: 36.8 % (ref 36.0–46.0)
Hemoglobin: 12.2 g/dL (ref 12.0–15.0)
MCH: 30.7 pg (ref 26.0–34.0)
MCHC: 33.2 g/dL (ref 30.0–36.0)
MCV: 92.5 fL (ref 80.0–100.0)
Platelets: 239 10*3/uL (ref 150–400)
RBC: 3.98 MIL/uL (ref 3.87–5.11)
RDW: 12.6 % (ref 11.5–15.5)
WBC: 8.3 10*3/uL (ref 4.0–10.5)
nRBC: 0 % (ref 0.0–0.2)

## 2022-06-05 LAB — APTT: aPTT: 22 seconds — ABNORMAL LOW (ref 24–36)

## 2022-06-05 LAB — PROTIME-INR
INR: 0.9 (ref 0.8–1.2)
Prothrombin Time: 11.8 seconds (ref 11.4–15.2)

## 2022-06-05 MED ORDER — LACTATED RINGERS IV BOLUS
1000.0000 mL | Freq: Once | INTRAVENOUS | Status: AC
  Administered 2022-06-05: 1000 mL via INTRAVENOUS

## 2022-06-05 MED ORDER — DIAZEPAM 5 MG PO TABS
5.0000 mg | ORAL_TABLET | Freq: Once | ORAL | Status: AC
  Administered 2022-06-05: 5 mg via ORAL
  Filled 2022-06-05: qty 1

## 2022-06-05 MED ORDER — ACETAMINOPHEN 325 MG PO TABS
650.0000 mg | ORAL_TABLET | Freq: Once | ORAL | Status: AC
  Administered 2022-06-05: 650 mg via ORAL
  Filled 2022-06-05: qty 2

## 2022-06-05 MED ORDER — DIAZEPAM 5 MG PO TABS: 5.0000 mg | ORAL_TABLET | Freq: Three times a day (TID) | ORAL | 0 refills | Status: AC | PRN

## 2022-06-05 NOTE — ED Notes (Signed)
Ambulatory to bathroom to void. Gait steady. Denies dizziness. States she feels much better post interventions.

## 2022-06-05 NOTE — Discharge Instructions (Addendum)
You are being prescribed a strong medicine called Valium to help with your vertigo.  While this medicine is effective, can cause side effects such as dizziness, sleepiness, or other side effects.  Do not combine with alcohol or other drugs and do not drive or operate heavy machinery while on this.  If you develop new or worsening headache, dizziness, weakness or numbness, or any other new/concerning symptoms then return to the ER or call 911.

## 2022-06-05 NOTE — ED Provider Notes (Signed)
[facility] Provider Note   CSN: [id number] Arrival date & time: 06/05/22  1049     History  Chief Complaint  Patient presents with   Dizziness    Stacy Orozco is a 77 y.o. female.  HPI 77 year old female presents with dizziness.  Symptoms originally started on 5/5.  Woke up feeling dizzy.  Significant movements make her get very dizzy.  Still had the symptoms but not as bad over the last 3 days and then it recurrently got worse today.  She woke up feeling okay but then when she was in the shower she got nauseated and dizzy.  She fell once a few days ago but no injuries.  She has had a moderate occipital headache during this whole time.  She denies any double vision, weakness or numbness, speech abnormalities.  Dizziness primarily happens with certain movements such as standing up.  She has had vertigo rarely before but nothing to this extent.  She has tried Dramamine and over yesterday and today has tried meclizine.  These seem to temporarily help but have not fully help.  She has also tried what sounds like the Epley maneuver at home without relief. No tinnitus.  Home Medications Prior to Admission medications   Medication Sig Start Date End Date Taking? Authorizing Provider  diazepam (VALIUM) 5 MG tablet Take 1 tablet (5 mg total) by mouth every 8 (eight) hours as needed (vertigo). 06/05/22  Yes Pricilla Loveless, MD  amitriptyline (ELAVIL) 10 MG tablet Take 10 mg by mouth at bedtime. 04/20/20   [provider]  atorvastatin (LIPITOR) 20 MG tablet Take 20 mg by mouth daily.    [provider]  calcium carbonate (OS-CAL) 600 MG TABS tablet Take 600 mg by mouth 2 (two) times daily with a meal. Patient not taking: Reported on 07/23/2021    [provider]  citalopram (CELEXA) 20 MG tablet Take 1 tablet (20 mg total) by mouth daily. 05/22/15   Alvira Monday, MD  cyclobenzaprine (FLEXERIL) 10 MG tablet Take 1 tablet (10  mg total) by mouth 2 (two) times daily as needed for muscle spasms. Patient not taking: Reported on 07/23/2021 07/14/20   Wynetta Fines, MD  glipiZIDE (GLUCOTROL XL) 5 MG 24 hr tablet Take 5 mg by mouth daily. 09/12/19   [provider]  ibuprofen (ADVIL,MOTRIN) 200 MG tablet Take 200 mg by mouth every 6 (six) hours as needed for moderate pain.    [provider]  losartan (COZAAR) 50 MG tablet Take 50 mg by mouth daily. 04/20/20   [provider]  metFORMIN (GLUCOPHAGE-XR) 500 MG 24 hr tablet Take 500-1,000 mg by mouth 2 (two) times daily. 500 mg at supper & 1000 mg at bedtime 04/24/15   [provider]  metoprolol tartrate (LOPRESSOR) 100 MG tablet Take 1 tablet (100 mg total) by mouth once for 1 dose. Take 2 hours prior to the procedure. 05/21/21 05/21/21  Runell Gess, MD  naproxen sodium (ALEVE) 220 MG tablet Take 220 mg by mouth daily as needed (pain).    [provider]  vitamin E 400 UNIT capsule Take 400 Units by mouth daily.    [provider]      Allergies    Penicillins    Review of Systems   Review of Systems  Eyes:  Negative for visual disturbance.  Neurological:  Positive for dizziness and headaches. Negative for syncope, weakness, light-headedness and numbness.    Physical  Exam Updated Vital Signs BP (!) 151/56   Pulse (!) 58   Temp 98 F (36.7 C) (Oral)   Resp 17   Ht 5\' 6"  (1.676 m)   Wt 68.5 kg   SpO2 94%   BMI 24.37 kg/m  Physical Exam Vitals and nursing note reviewed.  Constitutional:      General: She is not in acute distress.    Appearance: She is well-developed. She is not ill-appearing.  HENT:     Head: Normocephalic and atraumatic.     Right Ear: Tympanic membrane normal.     Left Ear: Tympanic membrane normal.  Eyes:     Extraocular Movements: Extraocular movements intact.     Pupils: Pupils are equal, round, and reactive to light.  Cardiovascular:     Rate and Rhythm: Normal rate and  regular rhythm.     Heart sounds: Normal heart sounds.  Pulmonary:     Effort: Pulmonary effort is normal.     Breath sounds: Normal breath sounds.  Abdominal:     Palpations: Abdomen is soft.     Tenderness: There is no abdominal tenderness.  Musculoskeletal:     Cervical back: No rigidity.  Skin:    General: Skin is warm and dry.  Neurological:     Mental Status: She is alert.     Comments: CN 3-12 grossly intact. 5/5 strength in all 4 extremities. Grossly normal sensation. Normal finger to nose.  Patient is able to ambulate without assistance. Once she stopped very briefly as she felt dizzy but was able to steady herself and walk again.  However, when she laid down in bed she felt the vertigo come on again and I was able to witness some brief nystagmus.      ED Results / Procedures / Treatments   Labs (all labs ordered are listed, but only abnormal results are displayed) Labs Reviewed  APTT - Abnormal; Notable for the following components:      Result Value   aPTT 22 (*)    All other components within normal limits  COMPREHENSIVE METABOLIC PANEL - Abnormal; Notable for the following components:   Glucose, Bld 171 (*)    AST 12 (*)    All other components within normal limits  PROTIME-INR  CBC  DIFFERENTIAL  CBG MONITORING, ED    EKG EKG Interpretation  Date/Time:  Thursday Jun 05 2022 11:16:46 EDT Ventricular Rate:  73 PR Interval:  175 QRS Duration: 93 QT Interval:  396 QTC Calculation: 437 R Axis:   63 Text Interpretation: Sinus rhythm no acute ST/T changes Confirmed by Pricilla Loveless [phone]) on 06/05/2022 1:28:59 PM  Radiology CT HEAD WO CONTRAST  Result Date: 06/05/2022 CLINICAL DATA:  Vertigo.  Dizzy spells since Sunday. EXAM: CT HEAD WITHOUT CONTRAST TECHNIQUE: Contiguous axial images were obtained from the base of the skull through the vertex without intravenous contrast. RADIATION DOSE REDUCTION: This exam was performed according to the departmental  dose-optimization program which includes automated exposure control, adjustment of the mA and/or kV according to patient size and/or use of iterative reconstruction technique. COMPARISON:  None FINDINGS: Brain: No evidence of acute infarction, hemorrhage, hydrocephalus, extra-axial collection or mass lesion/mass effect. There is mild patchy low-attenuation within the subcortical and periventricular white matter compatible with chronic microvascular disease. Vascular: No hyperdense vessel or unexpected calcification. Skull: Normal. Negative for fracture or focal lesion. Sinuses/Orbits: No acute finding. Other: None. IMPRESSION: 1. No acute intracranial abnormalities. 2. Chronic microvascular disease Electronically Signed   By:  Signa Kell M.D.   On: 06/05/2022 12:37    Procedures Procedures    Medications Ordered in ED Medications  lactated ringers bolus 1,000 mL (0 mLs Intravenous Stopped 06/05/22 1441)  diazepam (VALIUM) tablet 5 mg (5 mg Oral Given 06/05/22 1321)  acetaminophen (TYLENOL) tablet 650 mg (650 mg Oral Given 06/05/22 1331)    ED Course/ Medical Decision Making/ A&P                             Medical Decision Making Amount and/or Complexity of Data Reviewed Labs: ordered.    Details: Normal WBC.  Unremarkable electrolytes. Radiology: ordered and independent interpretation performed.    Details: No head bleed. ECG/medicine tests: ordered and independent interpretation performed.    Details: No acute ST/T changes  Risk OTC drugs. Prescription drug management.   Presentation is consistent with peripheral vertigo.  Sound like she is been using Antivert and Epley maneuver at home without relief.  She was given some fluids and a dose of oral Valium and was able to ambulate without recurrent vertigo.  My suspicion that this is stroke/central cause is unlikely.  I will refer her to vestibular rehab and give her ENT referral and a short course of Valium as needed the dizziness.   Otherwise, neuroexam is benign and given she is ambulatory I highly doubt stroke.  Will discharge home with return precautions.        Final Clinical Impression(s) / ED Diagnoses Final diagnoses:  Peripheral vertigo, unspecified laterality    Rx / DC Orders ED Discharge Orders          Ordered    PT vestibular rehab        06/05/22 1455    diazepam (VALIUM) 5 MG tablet  Every 8 hours PRN        06/05/22 1455              Pricilla Loveless, MD 06/05/22 1514

## 2022-06-05 NOTE — ED Triage Notes (Signed)
Patient arrives ambulatory by POV c/o dizzy spells since Sunday morning. Reports taking dramamine and meclizine. Patient reports "my head doesn't feel right" to top and back of head. States when laying down the room starts to spin. Patient has equal grip strength, no numbness or tingling. Normal speech and no facial droop noted.

## 2023-02-25 ENCOUNTER — Encounter (HOSPITAL_BASED_OUTPATIENT_CLINIC_OR_DEPARTMENT_OTHER): Payer: Self-pay | Admitting: Emergency Medicine

## 2023-02-25 ENCOUNTER — Ambulatory Visit (HOSPITAL_BASED_OUTPATIENT_CLINIC_OR_DEPARTMENT_OTHER)
Admission: EM | Admit: 2023-02-25 | Discharge: 2023-02-25 | Disposition: A | Discharge status: Home / Self Care | Payer: Medicare HMO | Attending: Family Medicine | Admitting: Family Medicine

## 2023-02-25 DIAGNOSIS — H8110 Benign paroxysmal vertigo, unspecified ear: Secondary | ICD-10-CM | POA: Diagnosis not present

## 2023-02-25 DIAGNOSIS — R112 Nausea with vomiting, unspecified: Secondary | ICD-10-CM

## 2023-02-25 DIAGNOSIS — W19XXXA Unspecified fall, initial encounter: Secondary | ICD-10-CM

## 2023-02-25 DIAGNOSIS — M542 Cervicalgia: Secondary | ICD-10-CM

## 2023-02-25 DIAGNOSIS — Y92009 Unspecified place in unspecified non-institutional (private) residence as the place of occurrence of the external cause: Secondary | ICD-10-CM

## 2023-02-25 DIAGNOSIS — R519 Headache, unspecified: Secondary | ICD-10-CM | POA: Diagnosis not present

## 2023-02-25 HISTORY — DX: Dizziness and giddiness: R42

## 2023-02-25 MED ORDER — ONDANSETRON 4 MG PO TBDP: 4.0000 mg | ORAL_TABLET | Freq: Three times a day (TID) | ORAL | 0 refills | Status: AC | PRN

## 2023-02-25 NOTE — Discharge Instructions (Addendum)
Exam is mostly normal.  She does have some pain with movement of her neck and shoulders.  No sign of any kind of traumatic brain injury.  Further imaging or CT scan deferred at this time.  Reviewed signs and symptoms of worsening head injury or subdural bleed and reasons to return here or go to the emergency room.  Follow-up if symptoms do not improve, worsen or new symptoms occur.

## 2023-02-25 NOTE — ED Triage Notes (Signed)
Pt has vertigo last week and did rehab to try to help with symptoms. Report tried exercises at home but would get nauseated.  Pt vomited last night couple times. Fell last night and hit her head on wall. Reports when turns her head neck hurts. Put ice on area last night. Taking tylenol for pains.

## 2023-02-25 NOTE — ED Provider Notes (Addendum)
[facility]    CSN: [id number] Arrival date & time: 02/25/23  1445      History   Chief Complaint Chief Complaint  Patient presents with   Fall   Head Injury    HPI Stacy Orozco is a 78 y.o. female.   Pt has vertigo last week and did rehab to try to help with symptoms. Report tried exercises at home but would get nauseated.  Pt vomited last night couple times. Fell last night and hit her head on wall. Reports when turns her head neck hurts. Put ice on area last night. Taking tylenol for pains.    Fall Pertinent negatives include no chest pain, no abdominal pain and no shortness of breath.  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      Home Medications    Prior to Admission medications   Medication Sig Start Date End Date Taking? Authorizing Provider  amitriptyline (ELAVIL) 10 MG tablet Take 10 mg by mouth at bedtime. 04/20/20   [provider]  atorvastatin (LIPITOR) 20 MG tablet Take 20 mg by mouth daily.    [provider]  citalopram (CELEXA) 20 MG tablet Take 1 tablet (20 mg total) by mouth daily. 05/22/15    Alvira Monday, MD  diazepam (VALIUM) 5 MG tablet Take 1 tablet (5 mg total) by mouth every 8 (eight) hours as needed (vertigo). 06/05/22   Pricilla Loveless, MD  glipiZIDE (GLUCOTROL XL) 5 MG 24 hr tablet Take 5 mg by mouth daily. 09/12/19   [provider]  ibuprofen (ADVIL,MOTRIN) 200 MG tablet Take 200 mg by mouth every 6 (six) hours as needed for moderate pain.    [provider]  losartan (COZAAR) 50 MG tablet Take 50 mg by mouth daily. 04/20/20   [provider]  metFORMIN (GLUCOPHAGE-XR) 500 MG 24 hr tablet Take 500-1,000 mg by mouth 2 (two) times daily. 500 mg at supper & 1000 mg at bedtime 04/24/15   [provider]  metoprolol tartrate (LOPRESSOR) 100 MG tablet Take 1 tablet (100 mg total) by mouth once for 1 dose. Take 2 hours prior to the procedure. 05/21/21 05/21/21  Runell Gess, MD  naproxen sodium (ALEVE) 220 MG tablet Take 220 mg by mouth daily as needed (pain).    [provider]  vitamin E 400 UNIT capsule Take 400 Units by mouth daily.    [provider]    Family History History reviewed. No pertinent family history.  Social History Social History   Tobacco Use   Smoking status: Never    Passive exposure: Never  Smokeless tobacco: Never  Substance Use Topics   Alcohol use: No   Drug use: No     Allergies   Clindamycin, Atorvastatin, Bupropion, Fluoxetine, Sertraline, and Penicillins   Review of Systems Review of Systems  Constitutional:  Negative for chills and fever.  HENT:  Negative for ear pain and sore throat.   Eyes:  Negative for pain and visual disturbance.  Respiratory:  Negative for cough and shortness of breath.   Cardiovascular:  Negative for chest pain and palpitations.  Gastrointestinal:  Negative for abdominal pain, constipation, diarrhea, nausea and vomiting.  Genitourinary:  Negative for dysuria and hematuria.  Musculoskeletal:  Positive for neck pain. Negative for arthralgias and back  pain.  Skin:  Negative for color change and rash.  Neurological:  Positive for dizziness and syncope. Negative for seizures.  All other systems reviewed and are negative.    Physical Exam Triage Vital Signs ED Triage Vitals  Encounter Vitals Group     BP 02/25/23 1505 121/73     Systolic BP Percentile --      Diastolic BP Percentile --      Pulse Rate 02/25/23 1505 75     Resp 02/25/23 1505 16     Temp 02/25/23 1505 98.4 F (36.9 C)     Temp Source 02/25/23 1505 Oral     SpO2 02/25/23 1505 98 %     Weight --      Height --      Head Circumference --      Peak Flow --      Pain Score 02/25/23 1501 5     Pain Loc --      Pain Education --      Exclude from Growth Chart --    No data found.  Updated Vital Signs BP 121/73 (BP Location: Left Arm)   Pulse 75   Temp 98.4 F (36.9 C) (Oral)   Resp 16   SpO2 98%   Visual Acuity Right Eye Distance:   Left Eye Distance:   Bilateral Distance:    Right Eye Near:   Left Eye Near:    Bilateral Near:     Physical Exam Vitals and nursing note reviewed.  Constitutional:      General: She is not in acute distress.    Appearance: She is well-developed. She is not ill-appearing or toxic-appearing.  HENT:     Head: Normocephalic and atraumatic.     Right Ear: Hearing, tympanic membrane, ear canal and external ear normal.     Left Ear: Hearing, tympanic membrane, ear canal and external ear normal.     Nose: Nose normal.     Mouth/Throat:     Lips: Pink.     Mouth: Mucous membranes are moist.  Eyes:     Conjunctiva/sclera: Conjunctivae normal.     Pupils: Pupils are equal, round, and reactive to light.  Neck:     Comments: Good range of motion but some complaint of pain with posterior extension. Cardiovascular:     Rate and Rhythm: Normal rate and regular rhythm.     Heart sounds: S1 normal and S2 normal. No murmur heard. Pulmonary:     Effort: Pulmonary effort is normal. No respiratory distress.     Breath sounds:  Normal breath sounds. No decreased breath sounds, wheezing, rhonchi or rales.  Abdominal:     Palpations: Abdomen is soft.     Tenderness: There is no abdominal tenderness.  Musculoskeletal:  General: No swelling.     Cervical back: Neck supple.     Comments: Good range of motion of neck.  Complain of pain with posterior extension of neck.  No pain with palpation of the muscles of the neck or shoulders.  Unable to locate any swelling or abrasions or injury on the scalp.  Lymphadenopathy:     Head:     Right side of head: No submental, submandibular, tonsillar, preauricular or posterior auricular adenopathy.     Left side of head: No submental, submandibular, tonsillar, preauricular or posterior auricular adenopathy.     Cervical: No cervical adenopathy.     Right cervical: No superficial cervical adenopathy.    Left cervical: No superficial cervical adenopathy.  Skin:    General: Skin is warm and dry.     Capillary Refill: Capillary refill takes less than 2 seconds.     Findings: No rash.  Neurological:     Mental Status: She is alert and oriented to person, place, and time.     Cranial Nerves: Cranial nerves 2-12 are intact.  Psychiatric:        Mood and Affect: Mood normal.      UC Treatments / Results  Labs (all labs ordered are listed, but only abnormal results are displayed) Labs Reviewed - No data to display  EKG   Radiology No results found.  Procedures Procedures (including critical care time)  Medications Ordered in UC Medications - No data to display  Initial Impression / Assessment and Plan / UC Course  I have reviewed the triage vital signs and the nursing notes.  Pertinent labs & imaging results that were available during my care of the patient were reviewed by me and considered in my medical decision making (see chart for details).  Exam is clear.  Neurologic exam is normal.  She does have some pain with movement of her neck and shoulders.   Currently not a candidate for anything that would sedate her worsen her risk of falls.  Discussed possible CT scan at this time the patient and her adult daughter declined.  Provided information about monitoring after traumatic head injury.  Follow-up if symptoms do not improve, worsen or new symptoms occur.  Sometimes with the vertigo she has nausea and vomiting.  Added ondansetron, 4 mg, ODT, melt on tongue every 8 hours as needed for nausea and vomiting.  We discussed this during the visit and it is not on her AVS but she is aware of it and requested it. Final Clinical Impressions(s) / UC Diagnoses   Final diagnoses:  Benign paroxysmal positional vertigo, unspecified laterality  Fall in home, initial encounter  Nonintractable headache, unspecified chronicity pattern, unspecified headache type  Neck pain     Discharge Instructions      Exam is mostly normal.  She does have some pain with movement of her neck and shoulders.  No sign of any kind of traumatic brain injury.  Further imaging or CT scan deferred at this time.  Reviewed signs and symptoms of worsening head injury or subdural bleed and reasons to return here or go to the emergency room.  Follow-up if symptoms do not improve, worsen or new symptoms occur.     ED Prescriptions   None    PDMP not reviewed this encounter.   Prescilla Sours, FNP 02/25/23 1610    Prescilla Sours, FNP 02/25/23 [phone]

## 2023-04-08 ENCOUNTER — Encounter: Payer: Self-pay | Admitting: *Deleted

## 2023-04-08 ENCOUNTER — Other Ambulatory Visit: Payer: Self-pay

## 2023-04-08 ENCOUNTER — Ambulatory Visit
Admission: EM | Admit: 2023-04-08 | Discharge: 2023-04-08 | Disposition: A | Discharge status: Home / Self Care | Attending: Family Medicine | Admitting: Family Medicine

## 2023-04-08 DIAGNOSIS — B349 Viral infection, unspecified: Secondary | ICD-10-CM

## 2023-04-08 LAB — POC COVID19/FLU A&B COMBO
Covid Antigen, POC: NEGATIVE
Influenza A Antigen, POC: NEGATIVE
Influenza B Antigen, POC: NEGATIVE

## 2023-04-08 LAB — POCT RAPID STREP A (OFFICE): Rapid Strep A Screen: NEGATIVE

## 2023-04-08 NOTE — Discharge Instructions (Signed)

## 2023-04-08 NOTE — ED Triage Notes (Signed)
 C/O waking 2 nights ago with severe sore throat and HA, rhinorrhea, and cough. Yesterday AM felt much better, but then sxs started again last night. Denies fevers.

## 2023-04-08 NOTE — ED Provider Notes (Signed)
 [facility]    CSN: [id number] Arrival date & time: 04/08/23  1155      History   Chief Complaint No chief complaint on file.   HPI Stacy Orozco is a 78 y.o. female  presents for evaluation of URI symptoms for 2 days. Patient reports associated symptoms of sore throat, cough, congestion, headache. Denies N/V/D, fevers, ear pain, body aches, shortness of breath. Patient does not have a hx of asthma. Patient is not an active smoker.   Reports no sick contacts. Pt has no other concerns at this time.   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      Home Medications    Prior to Admission medications   Medication Sig Start Date End Date Taking? Authorizing Provider  DULoxetine (CYMBALTA) 30 MG capsule 1 capsule. 12/02/22  Yes [provider]  amitriptyline (ELAVIL) 10 MG tablet Take 10 mg by mouth at bedtime. 04/20/20   [provider]  atorvastatin (LIPITOR) 20 MG tablet Take 20 mg by mouth daily.    [provider]  citalopram (CELEXA) 20 MG tablet Take 1 tablet (20 mg total) by mouth daily. 05/22/15   Alvira Monday, MD  diazepam (VALIUM) 5 MG tablet Take 1 tablet (5 mg total) by mouth every 8 (eight) hours as needed  (vertigo). 06/05/22   Pricilla Loveless, MD  glipiZIDE (GLUCOTROL XL) 5 MG 24 hr tablet Take 5 mg by mouth daily. 09/12/19  Yes [provider]  ibuprofen (ADVIL,MOTRIN) 200 MG tablet Take 200 mg by mouth every 6 (six) hours as needed for moderate pain.    [provider]  losartan (COZAAR) 50 MG tablet Take 50 mg by mouth daily. 04/20/20  Yes [provider]  metFORMIN (GLUCOPHAGE-XR) 500 MG 24 hr tablet Take 500-1,000 mg by mouth 2 (two) times daily. 500 mg at supper & 1000 mg at bedtime 04/24/15  Yes [provider]  metoprolol tartrate (LOPRESSOR) 100 MG tablet Take 1 tablet (100 mg total) by mouth once for 1 dose. Take 2 hours prior to the procedure. 05/21/21 04/08/23  Runell Gess, MD  naproxen sodium (ALEVE) 220 MG tablet Take 220 mg by mouth daily as needed (pain).    [provider]  ondansetron (ZOFRAN-ODT) 4 MG disintegrating tablet Take 1 tablet (4 mg total) by mouth every 8 (eight) hours as needed for nausea or vomiting. 02/25/23   Prescilla Sours, FNP  vitamin E 400 UNIT capsule Take 400 Units by mouth daily.   Yes [provider]    Family History No family history on file.  Social History Social History   Tobacco Use   Smoking status: Never    Passive exposure: Never  Smokeless tobacco: Never  Vaping Use   Vaping status: Never Used  Substance Use Topics   Alcohol use: No   Drug use: No     Allergies   Clindamycin, Atorvastatin, Bupropion, Fluoxetine, Sertraline, and Penicillins   Review of Systems Review of Systems  HENT:  Positive for congestion and sore throat.   Respiratory:  Positive for cough.   Neurological:  Positive for headaches.     Physical Exam Triage Vital Signs ED Triage Vitals [04/08/23 1230]  Encounter Vitals Group     BP (!) 171/71     Systolic BP Percentile      Diastolic BP Percentile      Pulse Rate 80     Resp 18     Temp 97.6 F (36.4 C)     Temp Source Oral     SpO2 97 %      Weight      Height      Head Circumference      Peak Flow      Pain Score 1     Pain Loc      Pain Education      Exclude from Growth Chart    No data found.  Updated Vital Signs BP (!) 171/71   Pulse 80   Temp 97.6 F (36.4 C) (Oral)   Resp 18   SpO2 97%   Visual Acuity Right Eye Distance:   Left Eye Distance:   Bilateral Distance:    Right Eye Near:   Left Eye Near:    Bilateral Near:     Physical Exam Vitals and nursing note reviewed.  Constitutional:      General: She is not in acute distress.    Appearance: She is well-developed. She is not ill-appearing.  HENT:     Head: Normocephalic and atraumatic.     Right Ear: Tympanic membrane and ear canal normal.     Left Ear: Tympanic membrane and ear canal normal.     Nose: Congestion present.     Mouth/Throat:     Mouth: Mucous membranes are moist.     Pharynx: Oropharynx is clear. Uvula midline. Posterior oropharyngeal erythema present.     Tonsils: No tonsillar exudate or tonsillar abscesses.  Eyes:     Conjunctiva/sclera: Conjunctivae normal.     Pupils: Pupils are equal, round, and reactive to light.  Cardiovascular:     Rate and Rhythm: Normal rate and regular rhythm.     Heart sounds: Normal heart sounds.  Pulmonary:     Effort: Pulmonary effort is normal.     Breath sounds: Normal breath sounds.  Musculoskeletal:     Cervical back: Normal range of motion and neck supple.  Lymphadenopathy:     Cervical: No cervical adenopathy.  Skin:    General: Skin is warm and dry.  Neurological:     General: No focal deficit present.     Mental Status: She is alert and oriented to person, place, and time.  Psychiatric:        Mood and Affect: Mood normal.        Behavior: Behavior normal.      UC Treatments / Results  Labs (all labs ordered are listed, but only abnormal results are displayed) Labs Reviewed  POC COVID19/FLU A&B COMBO  POCT RAPID STREP A (OFFICE)    EKG   Radiology No results  found.  Procedures Procedures (including critical care time)  Medications Ordered in UC Medications - No data to  display  Initial Impression / Assessment and Plan / UC Course  I have reviewed the triage vital signs and the nursing notes.  Pertinent labs & imaging results that were available during my care of the patient were reviewed by me and considered in my medical decision making (see chart for details).     Reviewed exam and symptoms with patient.  No red flags.  Negative rapid flu COVID and strep testing.  Discussed viral illness and symptomatic treatment.  PCP follow-up if symptoms do not improve.  ER precautions reviewed and patient verbalized understanding. Final Clinical Impressions(s) / UC Diagnoses   Final diagnoses:  Viral illness     Discharge Instructions      Please treat your symptoms with over the counter cough medication, tylenol or ibuprofen, humidifier, and rest. Viral illnesses can last 7-14 days. Please follow up with your PCP if your symptoms are not improving. Please go to the ER for any worsening symptoms. This includes but is not limited to fever you can not control with tylenol or ibuprofen, you are not able to stay hydrated, you have shortness of breath or chest pain.  Thank you for choosing [facility] for your healthcare needs. I hope you feel better soon!      ED Prescriptions   None    PDMP not reviewed this encounter.   Radford Pax, NP 04/08/23 1315

## 2023-07-12 ENCOUNTER — Emergency Department (HOSPITAL_BASED_OUTPATIENT_CLINIC_OR_DEPARTMENT_OTHER)

## 2023-07-12 ENCOUNTER — Other Ambulatory Visit: Payer: Self-pay

## 2023-07-12 ENCOUNTER — Emergency Department (HOSPITAL_BASED_OUTPATIENT_CLINIC_OR_DEPARTMENT_OTHER)
Admission: EM | Admit: 2023-07-12 | Discharge: 2023-07-12 | Disposition: A | Discharge status: Home / Self Care | Attending: Emergency Medicine | Admitting: Emergency Medicine

## 2023-07-12 ENCOUNTER — Encounter (HOSPITAL_BASED_OUTPATIENT_CLINIC_OR_DEPARTMENT_OTHER): Payer: Self-pay | Admitting: Emergency Medicine

## 2023-07-12 DIAGNOSIS — M25512 Pain in left shoulder: Secondary | ICD-10-CM | POA: Insufficient documentation

## 2023-07-12 DIAGNOSIS — Z7984 Long term (current) use of oral hypoglycemic drugs: Secondary | ICD-10-CM | POA: Insufficient documentation

## 2023-07-12 DIAGNOSIS — E119 Type 2 diabetes mellitus without complications: Secondary | ICD-10-CM | POA: Insufficient documentation

## 2023-07-12 LAB — BASIC METABOLIC PANEL WITH GFR
Anion gap: 11 (ref 5–15)
BUN: 18 mg/dL (ref 8–23)
CO2: 23 mmol/L (ref 22–32)
Calcium: 9.6 mg/dL (ref 8.9–10.3)
Chloride: 105 mmol/L (ref 98–111)
Creatinine, Ser: 0.67 mg/dL (ref 0.44–1.00)
GFR, Estimated: >60 mL/min (ref >60)
Glucose, Bld: 128 mg/dL — ABNORMAL HIGH (ref 70–99)
Potassium: 4.7 mmol/L (ref 3.5–5.1)
Sodium: 139 mmol/L (ref 135–145)

## 2023-07-12 LAB — CBC
HCT: 31.3 % — ABNORMAL LOW (ref 36.0–46.0)
Hemoglobin: 10.4 g/dL — ABNORMAL LOW (ref 12.0–15.0)
MCH: 29.7 pg (ref 26.0–34.0)
MCHC: 33.2 g/dL (ref 30.0–36.0)
MCV: 89.4 fL (ref 80.0–100.0)
Platelets: 236 10*3/uL (ref 150–400)
RBC: 3.5 MIL/uL — ABNORMAL LOW (ref 3.87–5.11)
RDW: 13.4 % (ref 11.5–15.5)
WBC: 6.6 10*3/uL (ref 4.0–10.5)
nRBC: 0 % (ref 0.0–0.2)

## 2023-07-12 LAB — TROPONIN T, HIGH SENSITIVITY: Troponin T High Sensitivity: <15 ng/L (ref <19)

## 2023-07-12 MED ORDER — METHOCARBAMOL 500 MG PO TABS: 500.0000 mg | ORAL_TABLET | Freq: Three times a day (TID) | ORAL | 0 refills | Status: AC | PRN

## 2023-07-12 MED ORDER — PREDNISONE 10 MG PO TABS: 20.0000 mg | ORAL_TABLET | Freq: Every day | ORAL | 0 refills | Status: AC

## 2023-07-12 NOTE — ED Provider Notes (Signed)
 [facility] Provider Note   CSN: [id number] Arrival date & time: 07/12/23  1037     Patient presents with: Shoulder Pain (bilateral)   Stacy Orozco is a 78 y.o. female.    Shoulder Pain Patient has had pain in her neck and shoulder for around 4 days now.  Goes from left shoulder to left arm.  No injury.  Does feel tight.  Had been worse with moving her head but now that is improving.  Blood pressure has been elevated.  No numbness or weakness.    Past Medical History:  Diagnosis Date   Depression    Diabetes mellitus without complication (HCC)    diet controlled   Hypercholesteremia    Hyperlipemia    Nuclear sclerotic cataract of both eyes 02/21/2021   Vertigo     Prior to Admission medications   Medication Sig Start Date End Date Taking? Authorizing Provider  methocarbamol (ROBAXIN) 500 MG tablet Take 1 tablet (500 mg total) by mouth every 8 (eight) hours as needed. 07/12/23  Yes Mozell Arias, MD  predniSONE  (DELTASONE ) 10 MG tablet Take 2 tablets (20 mg total) by mouth daily. 07/12/23  Yes Mozell Arias, MD  amitriptyline  (ELAVIL ) 10 MG tablet Take 10 mg by mouth at bedtime. 04/20/20   [provider]  atorvastatin  (LIPITOR) 20 MG tablet Take 20 mg by mouth daily.    [provider]  citalopram  (CELEXA ) 20 MG tablet Take 1 tablet (20 mg total) by mouth daily. 05/22/15   Scarlette Currier, MD  diazepam  (VALIUM ) 5 MG tablet Take 1 tablet (5 mg total) by mouth every 8 (eight) hours as needed (vertigo). 06/05/22   Jerilynn Montenegro, MD  DULoxetine (CYMBALTA) 30 MG capsule 1 capsule. 12/02/22   [provider]  glipiZIDE (GLUCOTROL XL) 5 MG 24 hr tablet Take 5 mg by mouth daily. 09/12/19   [provider]  ibuprofen (ADVIL,MOTRIN) 200 MG tablet Take 200 mg by mouth every 6 (six) hours as needed for moderate pain.    [provider]  losartan (COZAAR) 50 MG tablet Take 50 mg by mouth daily.  04/20/20   [provider]  metFORMIN  (GLUCOPHAGE -XR) 500 MG 24 hr tablet Take 500-1,000 mg by mouth 2 (two) times daily. 500 mg at supper & 1000 mg at bedtime 04/24/15   [provider]  metoprolol  tartrate (LOPRESSOR ) 100 MG tablet Take 1 tablet (100 mg total) by mouth once for 1 dose. Take 2 hours prior to the procedure. 05/21/21 04/08/23  Avanell Leigh, MD  naproxen sodium (ALEVE) 220 MG tablet Take 220 mg by mouth daily as needed (pain).    [provider]  ondansetron  (ZOFRAN -ODT) 4 MG disintegrating tablet Take 1 tablet (4 mg total) by mouth every 8 (eight) hours as needed for nausea or vomiting. 02/25/23   Guss Legacy, FNP  vitamin E  400 UNIT capsule Take 400 Units by mouth daily.    [provider]    Allergies: Clindamycin, Atorvastatin , Bupropion, Fluoxetine, Sertraline, and Penicillins    Review of Systems  Updated Vital Signs BP (!) 159/71 (BP Location: Right Wrist)   Pulse 62   Temp 98.4 F (36.9 C) (Oral)   Resp 16   Wt 63.5 kg   SpO2 98%   BMI 22.60 kg/m   Physical Exam Vitals and nursing note reviewed.  HENT:     Head: Normocephalic.  Neck:     Comments: Tenderness over the left trapezius.  No  deformity.  No midline tenderness.Pulmonary:     Breath sounds: No wheezing or rhonchi.  Chest:     Chest wall: No tenderness.   Musculoskeletal:        General: No tenderness.   Skin:    General: Skin is warm.   Neurological:     Mental Status: She is alert and oriented to person, place, and time.     (all labs ordered are listed, but only abnormal results are displayed) Labs Reviewed  BASIC METABOLIC PANEL WITH GFR - Abnormal; Notable for the following components:      Result Value   Glucose, Bld 128 (*)    All other components within normal limits  CBC - Abnormal; Notable for the following components:   RBC 3.50 (*)    Hemoglobin 10.4 (*)    HCT 31.3 (*)    All other components within normal limits  TROPONIN T, HIGH  SENSITIVITY  TROPONIN T, HIGH SENSITIVITY    EKG: EKG Interpretation Date/Time:  Sunday July 12 2023 11:10:11 EDT Ventricular Rate:  67 PR Interval:  180 QRS Duration:  96 QT Interval:  399 QTC Calculation: 422 R Axis:   29  Text Interpretation: Sinus rhythm Confirmed by Kamuela Magos ([id number]) on 07/12/2023 12:40:18 PM  Radiology: DG Chest 2 View Result Date: 07/12/2023 CLINICAL DATA:  Chest pain radiating to left arm, which began last night. EXAM: CHEST - 2 VIEW COMPARISON:  02/07/2021 FINDINGS: The heart size and mediastinal contours are within normal limits. Both lungs are clear. The visualized skeletal structures are unremarkable. IMPRESSION: No active cardiopulmonary disease. Electronically Signed   By: John A Stahl M.D.   On: 07/12/2023 11:43     Procedures   Medications Ordered in the ED - No data to display                                  Medical Decision Making Amount and/or Complexity of Data Reviewed Labs: ordered. Radiology: ordered.  Risk Prescription drug management.   Patient with neck pain.  Left-sided.  Does radiate down neck.  Has had some high blood pressure.  Occasionally dull headache.  I think most likely musculoskeletal.  Will get x-ray and some basic blood work.  Will also check his cardiac since she is higher risk.  Blood work and x-ray reassuring.  Cardiac workup reassuring.  Most likely musculoskeletal pain.  Will treat symptomatic with muscle relaxers and short course of steroids.  Appears stable for discharge home.    Final diagnoses:  Acute pain of left shoulder    ED Discharge Orders          Ordered    predniSONE (DELTASONE) 10 MG tablet  Daily        07/12/23 1243    methocarbamol (ROBAXIN) 500 MG tablet  Every 8 hours PRN        06 /15/25 1244               Mozell Arias, MD 07/12/23 1438

## 2023-07-12 NOTE — ED Notes (Signed)

## 2023-07-12 NOTE — ED Triage Notes (Signed)
 Bilateral shoulder pain and neck pain , left arm pain x 4 days .  Denies chest pain . No shortness of breath .  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

## 2023-07-12 NOTE — Discharge Instructions (Signed)
 It seems like the pain in your shoulder and arm are coming more from either the neck or the muscle.  Will do a short course of steroids to help along with some muscle relaxer.  Follow-up with your doctor as needed.

## 2024-02-12 ENCOUNTER — Encounter: Payer: Self-pay | Admitting: Gastroenterology

## 2024-03-09 ENCOUNTER — Ambulatory Visit: Admitting: Gastroenterology
